# Patient Record
Sex: Female | Born: 2000 | Race: Black or African American | Hispanic: No | Marital: Single | Smoking: Current some day smoker
Health system: Southern US, Community
[De-identification: ages and names within clinical notes are randomized; demographics above are authoritative.]

## PROBLEM LIST (undated history)

## (undated) DIAGNOSIS — L0591 Pilonidal cyst without abscess: Secondary | ICD-10-CM

## (undated) HISTORY — PX: OTHER SURGICAL HISTORY: SHX169

---
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
## 2022-02-23 ENCOUNTER — Other Ambulatory Visit: Payer: Self-pay

## 2022-02-23 ENCOUNTER — Emergency Department
Admission: EM | Admit: 2022-02-23 | Discharge: 2022-02-23 | Discharge status: Against Medical Advice | Payer: Medicaid Other | Attending: Emergency Medicine | Admitting: Emergency Medicine

## 2022-02-23 DIAGNOSIS — R2240 Localized swelling, mass and lump, unspecified lower limb: Secondary | ICD-10-CM | POA: Insufficient documentation

## 2022-02-23 DIAGNOSIS — Z5321 Procedure and treatment not carried out due to patient leaving prior to being seen by health care provider: Secondary | ICD-10-CM | POA: Insufficient documentation

## 2022-02-23 NOTE — ED Notes (Signed)
Pt to stat desk stating she is leaving.  Ambulated out of ED lobby

## 2022-02-23 NOTE — ED Triage Notes (Signed)
Pt c/o left sided swelling and tenderness in external labia. Pt reports she had boil on cocyx that had to be drained in past. Area is slightly swollen, no redness or discharge noted. Pt is AOX4, NAD noted.

## 2022-02-24 ENCOUNTER — Other Ambulatory Visit: Payer: Self-pay

## 2022-02-24 ENCOUNTER — Encounter: Payer: Self-pay | Admitting: Emergency Medicine

## 2022-02-24 ENCOUNTER — Emergency Department
Admission: EM | Admit: 2022-02-24 | Discharge: 2022-02-25 | Disposition: A | Discharge status: Home / Self Care | Payer: Medicaid Other | Attending: Emergency Medicine | Admitting: Emergency Medicine

## 2022-02-24 DIAGNOSIS — R300 Dysuria: Secondary | ICD-10-CM | POA: Diagnosis present

## 2022-02-24 DIAGNOSIS — N764 Abscess of vulva: Secondary | ICD-10-CM | POA: Insufficient documentation

## 2022-02-24 DIAGNOSIS — Z9889 Other specified postprocedural states: Secondary | ICD-10-CM

## 2022-02-24 LAB — URINALYSIS, ROUTINE W REFLEX MICROSCOPIC
Bilirubin Urine: NEGATIVE
Glucose, UA: NEGATIVE mg/dL
Hgb urine dipstick: NEGATIVE
Ketones, ur: 20 mg/dL — AB
Leukocytes,Ua: NEGATIVE
Nitrite: NEGATIVE
Protein, ur: 30 mg/dL — AB
Specific Gravity, Urine: 1.026 (ref 1.005–1.030)
pH: 5 (ref 5.0–8.0)

## 2022-02-24 LAB — POC URINE PREG, ED: Preg Test, Ur: NEGATIVE

## 2022-02-24 MED ORDER — SULFAMETHOXAZOLE-TRIMETHOPRIM 800-160 MG PO TABS
1.0000 | ORAL_TABLET | Freq: Once | ORAL | Status: AC
  Administered 2022-02-24: 1 via ORAL
  Filled 2022-02-24: qty 1

## 2022-02-24 MED ORDER — LIDOCAINE HCL (PF) 1 % IJ SOLN
5.0000 mL | Freq: Once | INTRAMUSCULAR | Status: AC
  Administered 2022-02-24: 5 mL
  Filled 2022-02-24: qty 5

## 2022-02-24 MED ORDER — BENZOCAINE 20 % MT SOLN
Freq: Once | OROMUCOSAL | Status: DC
Start: 2022-02-24 — End: 2022-02-24

## 2022-02-24 MED ORDER — OXYCODONE-ACETAMINOPHEN 5-325 MG PO TABS
1.0000 | ORAL_TABLET | Freq: Once | ORAL | Status: AC
  Administered 2022-02-24: 1 via ORAL
  Filled 2022-02-24: qty 1

## 2022-02-24 MED ORDER — BUPIVACAINE HCL (PF) 0.5 % IJ SOLN
10.0000 mL | Freq: Once | INTRAMUSCULAR | Status: AC
Start: 2022-02-24 — End: 2022-02-24
  Administered 2022-02-24: 10 mL
  Filled 2022-02-24: qty 10

## 2022-02-24 MED ORDER — LIDOCAINE-PRILOCAINE 2.5-2.5 % EX CREA
TOPICAL_CREAM | Freq: Once | CUTANEOUS | Status: AC
  Filled 2022-02-24: qty 5

## 2022-02-24 MED ORDER — AMOXICILLIN-POT CLAVULANATE 875-125 MG PO TABS
1.0000 | ORAL_TABLET | Freq: Once | ORAL | Status: AC
  Administered 2022-02-24: 1 via ORAL
  Filled 2022-02-24: qty 1

## 2022-02-24 MED ORDER — ONDANSETRON 4 MG PO TBDP
4.0000 mg | ORAL_TABLET | Freq: Once | ORAL | Status: AC
  Administered 2022-02-24: 4 mg via ORAL
  Filled 2022-02-24: qty 1

## 2022-02-24 NOTE — ED Triage Notes (Signed)
Pt via POV from home. Pt c/o L sided vaginal labia swelling for the past week, states she feels a "knot" and she thinks it might be an abscess. States that it does burn when she pees. Denies any vaginal discharge. Pt is A&OX4 and NAD

## 2022-02-24 NOTE — ED Provider Notes (Signed)
[facility] Emergency Department Provider Note     Event Date/Time   First MD Initiated Contact with Patient 02/24/22 1957     (approximate)   History   Abscess   HPI  Jacqueline Maynard is a 21 y.o. female with noncontributory medical history, presents to the ED with left-sided labial pain and swelling for the past week and a half.  Patient describes the "knot" that she thinks might be an abscess.  She denies any spontaneous drainage, difficulty urinating or passing stool.  She also denies any vaginal discharge or irritation.  She does note some mild burning discomfort when she passes urine.  She denies any fevers, chills, or sweats.  She denies any history of previous or recurrent vaginal abscesses but she has had a cutaneous abscess on the buttocks.   Physical Exam   Triage Vital Signs: ED Triage Vitals  Enc Vitals Group     BP 02/24/22 1831 (!) 142/99     Pulse Rate 02/24/22 1831 (!) 106     Resp 02/24/22 1831 20     Temp 02/24/22 1831 (!) 97.5 F (36.4 C)     Temp Source 02/24/22 1831 Oral     SpO2 02/24/22 1831 100 %     Weight 02/24/22 1832 134 lb (60.8 kg)     Height 02/24/22 1832 5\' 2"  (1.575 m)     Head Circumference --      Peak Flow --      Pain Score 02/24/22 1832 8     Pain Loc --      Pain Edu? --      Excl. in GC? --     Most recent vital signs: Vitals:   02/24/22 1831 02/25/22 0117  BP: (!) 142/99 115/78  Pulse: (!) 106 88  Resp: 20 15  Temp: (!) 97.5 F (36.4 C) (!) 97.5 F (36.4 C)  SpO2: 100% 100%    General Awake, no distress.  CV:  Good peripheral perfusion.  RESP:  Normal effort.  ABD:  No distention.  GU:  Normal external genitalia, with the exception of a large firm cystic lesion involving the left labia majora and mons pubis.   ED Results / Procedures / Treatments   Labs (all labs ordered are listed, but only abnormal results are displayed) Labs Reviewed  URINALYSIS, ROUTINE W REFLEX MICROSCOPIC -  Abnormal; Notable for the following components:      Result Value   Color, Urine YELLOW (*)    APPearance HAZY (*)    Ketones, ur 20 (*)    Protein, ur 30 (*)    Bacteria, UA RARE (*)    All other components within normal limits  POC URINE PREG, ED     EKG   RADIOLOGY   No results found.   PROCEDURES:  Critical Care performed: No  ..Incision and Drainage  Date/Time: 02/24/2022 9:01 PM  Performed by: Lissa Hoard, PA-C Authorized by: Lissa Hoard, PA-C   Consent:    Consent obtained:  Verbal   Consent given by:  Patient   Risks, benefits, and alternatives were discussed: yes     Risks discussed:  Bleeding, incomplete drainage and pain   Alternatives discussed:  Delayed treatment Universal protocol:    Site/side marked: yes     Patient identity confirmed:  Verbally with patient Location:    Type:  Abscess   Size:  5   Location:  Anogenital   Anogenital location:  Vulva Sedation:    Sedation type:  None Anesthesia:    Anesthesia method:  Local infiltration   Local anesthetic:  Lidocaine 1% w/o epi and bupivacaine 0.5% w/o epi Procedure type:    Complexity:  Complex Procedure details:    Ultrasound guidance: no     Needle aspiration: no     Incision types:  Single straight   Incision depth:  Subcutaneous   Wound management:  Probed and deloculated and irrigated with saline   Drainage:  Purulent and bloody   Drainage amount:  Scant   Wound treatment:  Wound left open   Packing materials:  1/2 in iodoform gauze   Amount 1/2" iodoform:  2 Post-procedure details:    Procedure completion:  Tolerated well, no immediate complications    MEDICATIONS ORDERED IN ED: Medications  lidocaine (PF) (XYLOCAINE) 1 % injection 5 mL (5 mLs Infiltration Given 02/24/22 2107)  bupivacaine(PF) (MARCAINE) 0.5 % injection 10 mL (10 mLs Infiltration Given 02/24/22 2107)  oxyCODONE-acetaminophen (PERCOCET/ROXICET) 5-325 MG per tablet 1 tablet (1 tablet Oral  Given 02/24/22 2105)  ondansetron (ZOFRAN-ODT) disintegrating tablet 4 mg (4 mg Oral Given 02/24/22 2105)  sulfamethoxazole-trimethoprim (BACTRIM DS) 800-160 MG per tablet 1 tablet (1 tablet Oral Given 02/24/22 2105)  amoxicillin-clavulanate (AUGMENTIN) 875-125 MG per tablet 1 tablet (1 tablet Oral Given 02/24/22 2105)  lidocaine-prilocaine (EMLA) cream ( Topical Given 02/24/22 2146)     IMPRESSION / MDM / ASSESSMENT AND PLAN / ED COURSE  I reviewed the triage vital signs and the nursing notes.                              Differential diagnosis includes, but is not limited to, vulvar abscess, pilonidal cyst abscess, Bartholin's gland cyst abscess  Patient's presentation is most consistent with acute complicated illness / injury requiring diagnostic workup.  Patient's diagnosis is consistent with a vulvar abscess.  Patient consented to a local I&D procedure which was performed.  Minimal amount of seropurulent drainage was expressed from the vulvar lesion.  Wound was packed and dressed as appropriate.  Patient will be discharged home with prescriptions for Bactrim, Augmentin, and pain medicine. Patient is to follow up with her primary provider or this ED in 3 days for wound check as needed or otherwise directed. Patient is given ED precautions to return to the ED for any worsening or new symptoms.     FINAL CLINICAL IMPRESSION(S) / ED DIAGNOSES   Final diagnoses:  Left genital labial abscess  Status post incision and drainage     Rx / DC Orders   ED Discharge Orders          Ordered    amoxicillin-clavulanate (AUGMENTIN) 875-125 MG tablet  2 times daily        02/25/22 0111    sulfamethoxazole-trimethoprim (BACTRIM DS) 800-160 MG tablet  2 times daily        02/25/22 0111    HYDROcodone-acetaminophen (NORCO) 5-325 MG tablet  3 times daily PRN        02/25/22 0112             Note:  This document was prepared using Dragon voice recognition software and may include unintentional  dictation errors.    Lissa Hoard, PA-C 02/26/22 2311    Minna Antis, MD 02/26/22 2336

## 2022-02-25 MED ORDER — AMOXICILLIN-POT CLAVULANATE 875-125 MG PO TABS: 1.0000 | ORAL_TABLET | Freq: Two times a day (BID) | ORAL | 0 refills | Status: AC

## 2022-02-25 MED ORDER — SULFAMETHOXAZOLE-TRIMETHOPRIM 800-160 MG PO TABS
1.0000 | ORAL_TABLET | Freq: Two times a day (BID) | ORAL | 0 refills | Status: AC
Start: 2022-02-25 — End: 2022-03-07

## 2022-02-25 MED ORDER — HYDROCODONE-ACETAMINOPHEN 5-325 MG PO TABS: 1.0000 | ORAL_TABLET | Freq: Three times a day (TID) | ORAL | 0 refills | Status: AC | PRN

## 2022-02-25 NOTE — Discharge Instructions (Addendum)
Wound clean, dry, and covered.  Avoid bathtub soaks for the next 3 days.  Remove the packing in 3 days.  Apply warm compresses to promote healing.  Take the 2 antibiotics as directed.  Take the pain medicine as needed.  Follow-up with Phineas Real or this ED for wound check.

## 2022-04-26 ENCOUNTER — Emergency Department
Admission: EM | Admit: 2022-04-26 | Discharge: 2022-04-26 | Disposition: A | Discharge status: Home / Self Care | Payer: Medicaid Other | Attending: Emergency Medicine | Admitting: Emergency Medicine

## 2022-04-26 ENCOUNTER — Other Ambulatory Visit: Payer: Self-pay

## 2022-04-26 DIAGNOSIS — L0501 Pilonidal cyst with abscess: Secondary | ICD-10-CM | POA: Insufficient documentation

## 2022-04-26 DIAGNOSIS — L0231 Cutaneous abscess of buttock: Secondary | ICD-10-CM

## 2022-04-26 MED ORDER — LIDOCAINE-EPINEPHRINE-TETRACAINE (LET) TOPICAL GEL
3.0000 mL | Freq: Once | TOPICAL | Status: AC
  Administered 2022-04-26: 3 mL via TOPICAL
  Filled 2022-04-26: qty 3

## 2022-04-26 MED ORDER — OXYCODONE-ACETAMINOPHEN 5-325 MG PO TABS
1.0000 | ORAL_TABLET | Freq: Once | ORAL | Status: AC
  Administered 2022-04-26: 1 via ORAL
  Filled 2022-04-26: qty 1

## 2022-04-26 MED ORDER — OXYCODONE-ACETAMINOPHEN 5-325 MG PO TABS: 1.0000 | ORAL_TABLET | ORAL | 0 refills | Status: DC | PRN

## 2022-04-26 MED ORDER — LIDOCAINE HCL (PF) 1 % IJ SOLN
5.0000 mL | Freq: Once | INTRAMUSCULAR | Status: AC
  Administered 2022-04-26: 5 mL via INTRADERMAL
  Filled 2022-04-26: qty 5

## 2022-04-26 MED ORDER — CLINDAMYCIN HCL 150 MG PO CAPS: 300.0000 mg | ORAL_CAPSULE | Freq: Three times a day (TID) | ORAL | 0 refills | Status: DC

## 2022-04-26 NOTE — ED Triage Notes (Signed)
Pt comes with c/o abscess to buttocks.

## 2022-04-26 NOTE — Discharge Instructions (Signed)
Follow-up with surgery to discuss having the entire cyst removed Return emergency department worsening Return in 2 days for packing removal Take the antibiotic and pain medication as prescribed It is okay if the packing falls out.  If it falls out on its own you do not need to return in 2 days you just need to follow-up with surgery

## 2022-04-26 NOTE — ED Provider Notes (Signed)
[facility] Provider Note    Event Date/Time   First MD Initiated Contact with Patient 04/26/22 1115     (approximate)   History   Abscess   HPI  Jacqueline Maynard is a 21 y.o. 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  Patient states that pain started 2 days ago.  States that severe pain.  She is also on her menstrual cycle which is making it worse.  She denies fever or chills.  Drainage from the area      Physical Exam   Triage Vital Signs: ED Triage Vitals  Enc Vitals Group     BP 04/26/22 1053 (!) 143/98     Pulse Rate 04/26/22 1053 (!) 130     Resp 04/26/22 1053 17     Temp 04/26/22 1053 98.2 F (36.8 C)     Temp src --      SpO2 04/26/22 1053 95 %     Weight --      Height --      Head Circumference --      Peak Flow --      Pain Score 04/26/22 1046 10     Pain Loc --      Pain Edu? --      Excl. in GC? --     Most recent vital signs: Vitals:   04/26/22 1053  BP: (!) 143/98  Pulse: (!) 130  Resp: 17  Temp: 98.2 F (36.8 C)  SpO2: 95%     General: Awake, no distress.   CV:  Good peripheral perfusion. regular rate and  rhythm Resp:  Normal effort.  Abd:  No distention.   Other:  Right buttock with swelling which is very minimal and redness noted to the pilonidal area, old incision is noted in the same area,   ED Results / Procedures / Treatments   Labs (all labs ordered are listed, but only abnormal results are displayed) Labs Reviewed - No data to display   EKG     RADIOLOGY     PROCEDURES:   .Marland KitchenIncision and Drainage  Date/Time: 04/26/2022 1:07 PM  Performed by: Faythe Ghee, PA-C Authorized by: Faythe Ghee, PA-C   Consent:    Consent obtained:  Verbal   Consent given by:  Patient   Risks, benefits, and alternatives were discussed: yes     Risks discussed:  Bleeding, incomplete drainage, pain, damage to other organs and  infection   Alternatives discussed:  Delayed treatment Universal protocol:    Immediately prior to procedure, a time out was called: yes     Patient identity confirmed:  Verbally with patient Location:    Type:  Pilonidal cyst Pre-procedure details:    Skin preparation:  Povidone-iodine Anesthesia:    Anesthesia method:  Topical application and local infiltration   Topical anesthetic:  LET   Local anesthetic:  Lidocaine 1% w/o epi Procedure type:    Complexity:  Simple Procedure details:    Incision types:  Stab incision   Incision depth:  Dermal   Wound management:  Probed and deloculated and irrigated with saline   Drainage:  Bloody and purulent   Drainage amount:  Copious   Wound treatment:  Drain placed   Packing materials:  1/2 in iodoform gauze Post-procedure details:    Procedure completion:  Tolerated well, no immediate complications    MEDICATIONS ORDERED IN ED: Medications  lidocaine-EPINEPHrine-tetracaine (LET) topical gel (  3 mLs Topical Given 04/26/22 1156)  oxyCODONE-acetaminophen (PERCOCET/ROXICET) 5-325 MG per tablet 1 tablet (1 tablet Oral Given 04/26/22 1155)  lidocaine (PF) (XYLOCAINE) 1 % injection 5 mL (5 mLs Intradermal Given by Other 04/26/22 1308)     IMPRESSION / MDM / ASSESSMENT AND PLAN / ED COURSE  I reviewed the triage vital signs and the nursing notes.                              Differential diagnosis includes, but is not limited to, abscess, cellulitis, herpes  Patient's presentation is most consistent with acute, uncomplicated illness.   The area of pain is very indicative of a pilonidal abscess.  We will have nursing staff give the patient a Percocet by mouth, LAT to be applied to the area.  We will then insert additional numbing medication.  Do not feel like the patient needs a CT to rule out rectal abscess as this area seems to be very localized.  With incision and drainage there was a very large amount of pus expelled.  It is very  copious yellow to green without harsh odor.  I feel that the patient should follow-up with surgery to have the pilonidal cyst removed.  However do have concerns that if this returns that the infection may be tracking into the lower part of the buttock.  She was instructed to return to the emergency department in 2 days for packing removal.  Take the antibiotic can pain medication as needed.  She is in agreement treatment plan.  She was discharged in stable condition.   FINAL CLINICAL IMPRESSION(S) / ED DIAGNOSES   Final diagnoses:  Abscess of buttock, right     Rx / DC Orders   ED Discharge Orders          Ordered    clindamycin (CLEOCIN) 150 MG capsule  3 times daily        04/26/22 1303    oxyCODONE-acetaminophen (PERCOCET) 5-325 MG tablet  Every 4 hours PRN        04/26/22 1303             Note:  This document was prepared using Dragon voice recognition software and may include unintentional dictation errors.    Faythe Ghee, PA-C 04/26/22 1309    Sharman Cheek, MD 04/26/22 [id number]

## 2022-04-28 ENCOUNTER — Encounter: Payer: Self-pay | Admitting: Emergency Medicine

## 2022-04-28 ENCOUNTER — Emergency Department
Admission: EM | Admit: 2022-04-28 | Discharge: 2022-04-28 | Disposition: A | Discharge status: Home / Self Care | Payer: Medicaid Other | Attending: Emergency Medicine | Admitting: Emergency Medicine

## 2022-04-28 ENCOUNTER — Other Ambulatory Visit: Payer: Self-pay

## 2022-04-28 DIAGNOSIS — Z4800 Encounter for change or removal of nonsurgical wound dressing: Secondary | ICD-10-CM | POA: Insufficient documentation

## 2022-04-28 DIAGNOSIS — Z5189 Encounter for other specified aftercare: Secondary | ICD-10-CM

## 2022-04-28 NOTE — ED Triage Notes (Signed)
Pt reports had an abscess drained and packed and is back for a recheck. Pt reports feels better

## 2022-04-28 NOTE — Discharge Instructions (Signed)
-  Please a 4 x 4 gauze pad along the affected area to catch the pulses drains.  -You may continue to wash daily in the shower with soap and water.  -Follow through with your appointment with the general surgeon in a few weeks.  -Continue taking all of your antibiotics as prescribed.  -Return to the emergency department anytime if you begin to experience any new or worsening symptoms.

## 2022-04-28 NOTE — ED Notes (Signed)
Pt A&Ox4 ambulatory at d/c with independent steady gait. Pt verbalized understanding of d/c instruction and follow up care.

## 2022-04-28 NOTE — ED Provider Notes (Signed)
[facility] Provider Note    Event Date/Time   First MD Initiated Contact with Patient 04/28/22 1704     (approximate)   History   Chief Complaint Wound Check   HPI Jacqueline Maynard is a 21 y.o. female, no significant medical history, presents emergency department for wound check of previous pilonidal cyst that was drained on 04/26/22.  Wound packing was placed.  She was advised to return to the emergency department for packing removal and wound recheck.  She states that she has felt much better since the initial drainage.  Has not had any fever/chills, body aches, or excessive bleeding/drainage from the affected site.  No other symptoms.  She has been taken her clindamycin as prescribed.  She has an appointment scheduled with general surgery in a few weeks.  History Limitations: No limitations.        Physical Exam  Triage Vital Signs: ED Triage Vitals  Enc Vitals Group     BP 04/28/22 1504 133/88     Pulse Rate 04/28/22 1504 81     Resp 04/28/22 1504 20     Temp 04/28/22 1504 98.1 F (36.7 C)     Temp Source 04/28/22 1504 Oral     SpO2 04/28/22 1504 98 %     Weight 04/28/22 1502 132 lb 4.4 oz (60 kg)     Height 04/28/22 1502 5\' 2"  (1.575 m)     Head Circumference --      Peak Flow --      Pain Score --      Pain Loc --      Pain Edu? --      Excl. in GC? --     Most recent vital signs: Vitals:   04/28/22 1504 04/28/22 1733  BP: 133/88 101/85  Pulse: 81 78  Resp: 20 18  Temp: 98.1 F (36.7 C)   SpO2: 98% 97%    General: Awake, NAD.  Skin: Warm, dry. No rashes or lesions. Eyes: PERRL. Conjunctivae normal.  CV: Good peripheral perfusion.  Resp: Normal effort.  Abd: Soft, non-tender. No distention.  Neuro: At baseline. No gross neurological deficits.  Musculoskeletal: Normal ROM of all extremities.   Focused Exam: 1 cm incision site immediately adjacent to the gluteal cleft on the right side.  Wound packing in place, appears  soaked.  Moderate amount of drainage present after removal of packing, though did cease after a few minutes.  No erythema, tenderness, or fluctuance around the wound edges.  Appears to be healing well.  Physical Exam    ED Results / Procedures / Treatments  Labs (all labs ordered are listed, but only abnormal results are displayed) Labs Reviewed - No data to display   EKG N/A.   RADIOLOGY  ED Provider Interpretation: N/A.  No results found.  PROCEDURES:  Critical Care performed: N/A.  Procedures    MEDICATIONS ORDERED IN ED: Medications - No data to display   IMPRESSION / MDM / ASSESSMENT AND PLAN / ED COURSE  I reviewed the triage vital signs and the nursing notes.                              Differential diagnosis includes, but is not limited to, pilonidal cyst, wound recheck, cellulitis  Assessment/Plan Patient presents for wound recheck and packing removal after initial incision/drainage of pilonidal cyst on 04/26/22.  Appears to be healing well.  Mild amount of drainage  present after packing removal, though I do not believe that she needs any additional packing material at this time.  Recommended that she place 4 x 4 gauze along the gluteal cleft daily to help catch additional drainage throughout the day.  Recommend that she continue taking her antibiotics as prescribed and to follow through with the appointment with general surgery in a few weeks.  She was agreeable to this plan.  We will discharge.  Provided the patient with anticipatory guidance, return precautions, and educational material. Encouraged the patient to return to the emergency department at any time if they begin to experience any new or worsening symptoms. Patient expressed understanding and agreed with the plan.   Patient's presentation is most consistent with acute, uncomplicated illness.       FINAL CLINICAL IMPRESSION(S) / ED DIAGNOSES   Final diagnoses:  Visit for wound check      Rx / DC Orders   ED Discharge Orders     None        Note:  This document was prepared using Dragon voice recognition software and may include unintentional dictation errors.   Varney Daily, [place] 04/28/22 1739    Pilar Jarvis, MD 04/28/22 2010

## 2022-05-14 ENCOUNTER — Encounter: Payer: Self-pay | Admitting: Surgery

## 2022-05-14 ENCOUNTER — Ambulatory Visit (INDEPENDENT_AMBULATORY_CARE_PROVIDER_SITE_OTHER): Payer: Medicaid Other | Admitting: Surgery

## 2022-05-14 ENCOUNTER — Other Ambulatory Visit: Payer: Self-pay

## 2022-05-14 VITALS — BP 112/63 | HR 84 | Temp 98.1°F | Ht 60.0 in | Wt 132.0 lb

## 2022-05-14 DIAGNOSIS — L0591 Pilonidal cyst without abscess: Secondary | ICD-10-CM | POA: Diagnosis not present

## 2022-05-14 NOTE — Patient Instructions (Addendum)
Finish the full course of Antibiotics.  You may try shaving the around the area or using Darene Lamer hair removal.   Please call the office if the area worsens.   Pilonidal Cyst Drainage  A pilonidal cyst is a fluid-filled sac that forms under the skin near the tailbone, at the top of the crease of the buttocks (pilonidal area). Incision and drainage is a procedure to open and drain a pilonidal cyst. You may need this procedure if your cyst becomes infected (pilonidal abscess). A pilonidal abscess may cause pain and swelling. There are three types of procedures to drain a pilonidal cyst. The type of procedure you have will depend on the size, location, and severity of your cyst. You may have: Incision and drainage with wound packing. Wound packing involves placing a germ-free packing material (gauze) into the incision. Marsupialization. This involves opening and draining the cyst. The edges of the incision are then stitched (sutured) to the skin around the incision. Incision and drainage without wound packing. The incision is closed and covered with a dressing. Tell a health care provider about: Any allergies you have. All medicines you are taking, including vitamins, herbs, eye drops, creams, and over-the-counter medicines. Any problems you or family members have had with anesthetic medicines. Any bleeding problems you have. Any surgeries you have had. Any medical conditions you have. Whether you are pregnant or may be pregnant. What are the risks? Talk to your health care provider about risks. These may include: Infection. Bleeding. Allergic reactions to medicines. The cyst coming back again (recurrence). What happens before the procedure? When to stop eating and drinking Follow instructions from your health care provider about what you may eat and drink. These may include: 8 hours before your procedure Stop eating most foods. Do not eat meat, fried foods, or fatty foods. Eat only light  foods, such as toast or crackers. All liquids are okay except energy drinks and alcohol. 6 hours before your procedure Stop eating. Drink only clear liquids, such as water, clear fruit juice, black coffee, plain tea, and sports drinks. Do not drink energy drinks or alcohol. 2 hours before your procedure Stop drinking all liquids. You may be allowed to take medicines with small sips of water. If you do not follow your health care provider's instructions, your procedure may be delayed or canceled. Medicines Ask your health care provider about: Changing or stopping your regular medicines. These include any diabetes medicines or blood thinners you take. Taking medicines such as aspirin and ibuprofen. These medicines can thin your blood. Do not take them unless your health care provider tells you to. Taking over-the-counter medicines, vitamins, herbs, and supplements. Surgery safety Ask your health care provider: How your surgery site will be marked. What steps will be taken to help prevent infection. These steps may include: Removing hair at the surgery site. Washing skin with a soap that kills germs. Taking antibiotics. General instructions Do not use any products that contain nicotine or tobacco for at least 4 weeks before the procedure. These products include cigarettes, chewing tobacco, and vaping devices, such as e-cigarettes. If you need help quitting, ask your health care provider. If you will be going home right after the procedure, plan to have a responsible adult: Take you home from the hospital or clinic. You will not be allowed to drive. Care for you for the time you are told. You may need help with wound care and dressing changes. What happens during the procedure? An IV may be  inserted into one of your veins. You may be given: A sedative. This helps you relax. Anesthesia. This will: Numb certain areas of your body. Make you fall asleep for surgery. You will lie down on  your stomach. Tape may be used to spread your buttocks. The area around the cyst will be cleaned. An incision will be made in the cyst. Your surgeon may insert a tube with a light and camera on the end (probe). This is done to see how deep the cyst is. Fluid or pus inside the cyst will be drained. The cyst will be flushed out (irrigated). The rest of the procedure will depend on the type of procedure you are having. For incision and drainage with wound packing: Gauze will be placed into the wound. This keeps the wound open so it can keep draining after surgery. It also helps the wound heal from the inside out. The area will be covered with a dressing. For marsupialization: The edges of the incision will be stitched to your skin. This keeps the wound open while it heals. It also helps deep wounds heal from the inside out. A dressing will be placed over the wound. For incision and drainage without wound packing: Some tissue around the cyst may be removed. The incision may be closed with sutures, skin glue, or adhesive strips. The incision will be covered with a dressing. These procedures may vary among health care providers and hospitals. What happens after the procedure? Your blood pressure, heart rate, breathing rate, and blood oxygen level will be monitored until you leave the hospital or clinic. You will be given medicine for pain as needed. If you were given a sedative during the procedure, it can affect you for several hours. Do not drive or operate machinery until your health care provider says that it is safe. Summary A pilonidal cyst is a fluid-filled sac that forms under the skin near the tailbone, at the top of the crease of the buttocks (pilonidal area). Incision and drainage is a procedure to open and drain the cyst. The type of procedure you have will depend on the size, location, and severity of your cyst. You may need this procedure if your cyst becomes infected (pilonidal  abscess). This information is not intended to replace advice given to you by your health care provider. Make sure you discuss any questions you have with your health care provider. Document Revised: 11/08/2021 Document Reviewed: 11/08/2021 Elsevier Patient Education  [place].

## 2022-05-15 ENCOUNTER — Encounter: Payer: Self-pay | Admitting: Surgery

## 2022-05-15 NOTE — Progress Notes (Signed)
Patient ID: Jacqueline Maynard, female   DOB: 08-Sep-2000, 21 y.o.   MRN: [id number]  HPI Jacqueline Maynard is a 21 y.o. female seen in consultation at the request of Caesar Chestnut, PA-C.  He did have an I&D of pilonidal abscess 2-1/2 weeks ago by the ER.  She was managed with antibiotics and now her symptoms have improved significantly.  She denies any major discharge.  Some very mild discomfort, mild pain  that is intermittent and sharp.  Does not have a wound anymore.  She is states that she has had this before a total of total of two times.  Does have some chronic anemia and last hemoglobin was 10.4.  Rest of the labs are normal except mild hypokalemia CXR personally reviewed No active cardiopulmonary disease  HPI  History reviewed. No pertinent past medical history.  Past Surgical History:  Procedure Laterality Date   i&d pilonidal cyst      History reviewed. No pertinent family history.  Social History Social History   Tobacco Use   Smoking status: Never   Smokeless tobacco: Never  Substance Use Topics   Alcohol use: No   Drug use: Yes    Allergies  Allergen Reactions   Peanut-Containing Drug Products Hives and Rash    Current Outpatient Medications  Medication Sig Dispense Refill   clindamycin (CLEOCIN) 150 MG capsule Take 2 capsules (300 mg total) by mouth 3 (three) times daily. 42 capsule 0   No current facility-administered medications for this visit.     Review of Systems Full ROS  was asked and was negative except for the information on the HPI  Physical Exam Blood pressure 112/63, pulse 84, temperature 98.1 F (36.7 C), temperature source Oral, height 5' (1.524 m), weight 132 lb (59.9 kg), last menstrual period 04/28/2022, SpO2 100 %. CONSTITUTIONAL: NAD. EYES: Pupils are equal, round,  Sclera are non-icteric.  Chaperone present EARS, NOSE, MOUTH AND THROAT: The oral mucosa is pink and moist. Hearing is intact to voice. LYMPH NODES:  Lymph nodes in the neck  are normal. RESPIRATORY:  Lungs are clear. There is normal respiratory effort, with equal breath sounds bilaterally, and without pathologic use of accessory muscles. CARDIOVASCULAR: Heart is regular without murmurs, gallops, or rubs. GI: The abdomen is  soft, nontender, and nondistended. There are no palpable masses. There is no hepatosplenomegaly. There are normal bowel sounds in all quadrants. GU: Rectal deferred.   MUSCULOSKELETAL: Normal muscle strength and tone. No cyanosis or edema.   Rectal: no masses and no bleeding SKIN: Turgor is good and there are no pathologic skin lesions or ulcers.  There is evidence of his scar to the right of the gluteal cleft.  There is very minimal induration there is no evidence of erythema there is no evidence of fluctuance or evidence of abscess NEUROLOGIC: Motor and sensation is grossly normal. Cranial nerves are grossly intact. PSYCH:  Oriented to person, place and time. Affect is normal.  Data Reviewed  I have personally reviewed the patient's imaging, laboratory findings and medical records.    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  Now she has a completely healed sacral skin and soft tissue.  No need for further surgical intervention or debridement at this time.  Discussed with her in detail about prevention of pilonidal disease with appropriate shaving.  If she were to recur again she will likely benefit from excision of pilonidal disease.  We will be available in case this happens.  She knows to call  if she has any further issues I spent  45 minutes in this encounter including coordination of her care, personally reviewing imaging studies, counseling the patient, placing orders and performing appropriate documentation  Sterling Big, MD FACS General Surgeon 05/15/2022, 9:54 AM

## 2022-09-06 ENCOUNTER — Encounter: Payer: Self-pay | Admitting: *Deleted

## 2022-09-06 ENCOUNTER — Other Ambulatory Visit: Payer: Self-pay

## 2022-09-06 ENCOUNTER — Emergency Department
Admission: EM | Admit: 2022-09-06 | Discharge: 2022-09-06 | Disposition: A | Discharge status: Home / Self Care | Payer: Medicaid Other | Attending: Emergency Medicine | Admitting: Emergency Medicine

## 2022-09-06 DIAGNOSIS — L0501 Pilonidal cyst with abscess: Secondary | ICD-10-CM | POA: Insufficient documentation

## 2022-09-06 DIAGNOSIS — L0291 Cutaneous abscess, unspecified: Secondary | ICD-10-CM

## 2022-09-06 MED ORDER — AMOXICILLIN-POT CLAVULANATE 875-125 MG PO TABS: 1.0000 | ORAL_TABLET | Freq: Two times a day (BID) | ORAL | 0 refills | Status: DC

## 2022-09-06 MED ORDER — LIDOCAINE-PRILOCAINE 2.5-2.5 % EX CREA
TOPICAL_CREAM | Freq: Once | CUTANEOUS | Status: DC
  Filled 2022-09-06: qty 5

## 2022-09-06 NOTE — ED Triage Notes (Signed)
Pt has a pilonidal cyst on the right buttock.  Sx for several weeks.  No drainage.  Pt has increased pain with swelling .  Pt alert.

## 2022-09-06 NOTE — ED Provider Triage Note (Signed)
Emergency Medicine Provider Triage Evaluation Note  Jacqueline Maynard, a 22 y.o. female  was evaluated in triage.  Pt complains of recurrence of abdominal cyst to the right buttocks.  Patient notes symptoms for the last several weeks but denies any spontaneous drainage.  She notes increasing pain and fullness pointing at this time.  No fevers chills or sweats reported.  Review of Systems  Positive: Pilonidal cyst Negative: FCS  Physical Exam  BP 125/82 (BP Location: Left Arm)   Pulse 96   Temp 99 F (37.2 C) (Oral)   Resp 18   Ht 5\' 2"  (1.575 m)   Wt 57.2 kg   LMP 08/23/2022   SpO2 99%   BMI 23.05 kg/m  Gen:   Awake, no distress   Resp:  Normal effort  MSK:   Moves extremities without difficulty  Other:    Medical Decision Making  Medically screening exam initiated at 8:10 PM.  Appropriate orders placed.  Jacqueline Maynard was informed that the remainder of the evaluation will be completed by another provider, this initial triage assessment does not replace that evaluation, and the importance of remaining in the ED until their evaluation is complete.  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   Melvenia Needles, PA-C 09/06/22 2014

## 2022-09-06 NOTE — Discharge Instructions (Signed)
Take Augmentin twice daily for seven days. Remove packing in three days.

## 2022-09-06 NOTE — ED Provider Notes (Signed)
[facility] Provider Note  Patient Contact: 11:35 PM (approximate)   History   Abscess   HPI  Jacqueline Maynard is a 22 y.o. female presents to the emergency department with a pilonidal cyst.  Patient states that she has required incision and drainage in the past.  No fever or chills.  Her pain is progressively worsening.      Physical Exam   Triage Vital Signs: ED Triage Vitals  Enc Vitals Group     BP 09/06/22 2003 125/82     Pulse Rate 09/06/22 2003 96     Resp 09/06/22 2003 18     Temp 09/06/22 2003 99 F (37.2 C)     Temp Source 09/06/22 2003 Oral     SpO2 09/06/22 2003 99 %     Weight 09/06/22 2004 126 lb (57.2 kg)     Height 09/06/22 2004 5\' 2"  (1.575 m)     Head Circumference --      Peak Flow --      Pain Score 09/06/22 2004 7     Pain Loc --      Pain Edu? --      Excl. in [place]? --     Most recent vital signs: Vitals:   09/06/22 2003  BP: 125/82  Pulse: 96  Resp: 18  Temp: 99 F (37.2 C)  SpO2: 99%     General: Alert and in no acute distress. Eyes:  PERRL. EOMI. Head: No acute traumatic findings ENT:      Nose: No congestion/rhinnorhea.      Mouth/Throat: Mucous membranes are moist. Neck: No stridor. No cervical spine tenderness to palpation. Cardiovascular:  Good peripheral perfusion Respiratory: Normal respiratory effort without tachypnea or retractions. Lungs CTAB. Good air entry to the bases with no decreased or absent breath sounds. Gastrointestinal: Bowel sounds 4 quadrants. Soft and nontender to palpation. No guarding or rigidity. No palpable masses. No distention. No CVA tenderness. Musculoskeletal: Full range of motion to all extremities.  Neurologic:  No gross focal neurologic deficits are appreciated.  Skin: Patient has a 3 cm x 3 cm pilonidal abscess. Other:   ED Results / Procedures / Treatments   Labs (all labs ordered are listed, but only abnormal results are displayed) Labs Reviewed - No data to  display      PROCEDURES:  Critical Care performed: No  ..Incision and Drainage  Date/Time: 09/06/2022 11:44 PM  Performed by: Lannie Fields, PA-C Authorized by: Lannie Fields, PA-C   Consent:    Consent obtained:  Verbal   Risks discussed:  Bleeding and incomplete drainage Universal protocol:    Procedure explained and questions answered to patient or proxy's satisfaction: yes     Patient identity confirmed:  Verbally with patient Location:    Type:  Abscess   Location:  Anogenital   Anogenital location:  Pilonidal Pre-procedure details:    Skin preparation:  Povidone-iodine Sedation:    Sedation type:  None Anesthesia:    Anesthesia method:  Topical application   Topical anesthetic:  EMLA cream Procedure type:    Complexity:  Simple Procedure details:    Incision types:  Stab incision   Drainage:  Purulent   Drainage amount:  Moderate   Packing materials:  None Post-procedure details:    Procedure completion:  Tolerated well, no immediate complications    MEDICATIONS ORDERED IN ED: Medications  lidocaine-prilocaine (EMLA) cream (has no administration in time range)     IMPRESSION / MDM /  ASSESSMENT AND PLAN / ED COURSE  I reviewed the triage vital signs and the nursing notes.                             Assessment and plan Pilonidal abscess 22 year old female presents to the emergency department with a pilonidal cyst.  Patient underwent incision and drainage without complication.  Patient was discharged with Augmentin.  Return precautions were given to return with new or worsening symptoms. FINAL CLINICAL IMPRESSION(S) / ED DIAGNOSES   Final diagnoses:  Abscess     Rx / DC Orders   ED Discharge Orders          Ordered    amoxicillin-clavulanate (AUGMENTIN) 875-125 MG tablet  2 times daily        09/06/22 2323             Note:  This document was prepared using Dragon voice recognition software and may include unintentional dictation  errors.   Vallarie Mare [place], PA-C 09/06/22 2346    Merlyn Lot, MD 09/06/22 2352

## 2022-09-10 ENCOUNTER — Telehealth: Payer: Self-pay | Admitting: Surgery

## 2022-09-10 ENCOUNTER — Encounter: Payer: Self-pay | Admitting: Surgery

## 2022-09-10 ENCOUNTER — Other Ambulatory Visit: Payer: Self-pay

## 2022-09-10 ENCOUNTER — Ambulatory Visit (INDEPENDENT_AMBULATORY_CARE_PROVIDER_SITE_OTHER): Payer: Medicaid Other | Admitting: Surgery

## 2022-09-10 VITALS — BP 134/90 | HR 132 | Temp 95.6°F | Ht 62.0 in | Wt 124.0 lb

## 2022-09-10 DIAGNOSIS — L0591 Pilonidal cyst without abscess: Secondary | ICD-10-CM | POA: Diagnosis not present

## 2022-09-10 NOTE — Telephone Encounter (Signed)
Tried calling patient, not able to leave a message, voice mailbox not available.  If patient calls back, please inform her of the following regarding scheduled surgery with Dr. Dahlia Byes   Pre-Admission date/time, and Surgery date at [facility].  Surgery Date: 10/04/22 Preadmission Testing Date: 09/25/22 (phone 8a-1p)  Also patient will need to call at [phone], between 1-3:00pm the day before surgery, to find out what time to arrive for surgery.

## 2022-09-10 NOTE — Telephone Encounter (Signed)
Tried calling patient to discuss surgery date options with her, but couldn't leave message, mailbox was full.

## 2022-09-10 NOTE — Patient Instructions (Addendum)
Our surgery scheduler will call you within 24-48 hours to schedule your surgery. Please have the [facility] surgery sheet available when speaking with her.    Pilonidal Cyst  A pilonidal cyst is a fluid-filled sac that forms under the skin near the tailbone, at the top of the crease of the buttocks (pilonidal area). Cysts that are small and not infected may not cause any problems. Cysts that become irritated or infected may grow and fill with pus. An infected cyst is called an abscess. A pilonidal abscess may cause pain and swelling. It may need to be drained or removed. What are the causes? The cause of this condition is not always known. In some cases, it may be caused by a hair that grows into your skin (ingrown hair). What increases the risk? You are more likely to develop this condition if: You are female. You have lots of hair near the crease of the buttocks. You are overweight. You have a dimple near the crease of the buttocks. You wear tight clothing. You do not bathe or shower often. You sit for long periods of time. What are the signs or symptoms? Symptoms of this condition may include pain, swelling, redness, and warmth in the pilonidal area. You may also be able to feel a lump near your tailbone if your cyst is big. If your cyst becomes infected, symptoms may include: Pus or fluid drainage. Fever. Pain, swelling, and redness getting worse. The lump getting bigger. How is this diagnosed? This condition may be diagnosed based on: Your symptoms and medical history. A physical exam. A blood test to check for infection. A test of a pus sample. How is this treated? You may not need any treatment if your cyst does not cause symptoms. If your cyst bothers you or is infected, you may need a procedure to drain or remove the cyst. Depending on the size, location, and severity of your cyst, your health care provider may: Make an incision in the cyst and drain it (incision anddrainage). Open  and drain the cyst, and then stitch the wound so that it stays open while it heals (marsupialization). You will be given instructions about how to care for your open wound while it heals. Remove all or part of the cyst, and then close the wound (cyst removal). You may need to take antibiotics before your procedure. Follow these instructions at home: Medicines Take over-the-counter and prescription medicines only as told by your health care provider. If you were prescribed antibiotics, take them as told by your health care provider. Do not stop taking the antibiotic even if you start to feel better. General instructions Keep the area around the cyst clean and dry. If there is fluid or pus draining from your cyst: Cover the area with a clean bandage (dressing). Wash the area gently with soap and water. Pat the area dry with a clean towel. Do not rub the area because that may cause bleeding. Remove hair from the area around the cyst only if your health care provider tells you to. Do not wear tight pants or sit in one position for long periods at a time. Contact a health care provider if: You have new redness, swelling, or pain. You have a fever. You have severe pain. Summary A pilonidal cyst is a fluid-filled sac that forms under the skin near the tailbone, at the top of the crease of the buttocks (pilonidal area). Cysts that become irritated or infected may grow and fill with pus. An  infected cyst is called an abscess. The cause of this condition is not always known. In some cases, it may be caused by a hair that grows into your skin (ingrown hair). You may not need any treatment if your cyst does not cause symptoms. If your cyst bothers you or is infected, you may need a procedure to drain or remove the cyst. This information is not intended to replace advice given to you by your health care provider. Make sure you discuss any questions you have with your health care provider. Document  Revised: 11/08/2021 Document Reviewed: 11/08/2021 Elsevier Patient Education  [place].

## 2022-09-11 NOTE — Telephone Encounter (Signed)
Left message with mom for patient to give [place] a call so that we can inform of the following scheduled surgery with Dr. Dahlia Byes.

## 2022-09-13 NOTE — Telephone Encounter (Signed)
Patient calls back, she is now informed of all dates regarding her surgery.   

## 2022-09-16 NOTE — Progress Notes (Signed)
Patient ID: Jacqueline Maynard, female   DOB: 09/28/2000, 22 y.o.   MRN: [id number]  HPI Jacqueline Maynard is a 22 y.o. female well-known to me with history of bilateral disease with prior I&D last year.  She now has recurrence of symptoms with sacrococcygeal pain that is sharp and intermittent and moderate intensity.  She also experiences chronic drainage.  No fevers no chills.  She is ready to have this taken care of  HPI  History reviewed. No pertinent past medical history.  Past Surgical History:  Procedure Laterality Date   i&d pilonidal cyst      History reviewed. No pertinent family history.  Social History Social History   Tobacco Use   Smoking status: Some Days    Types: Cigarettes   Smokeless tobacco: Never  Substance Use Topics   Alcohol use: Yes   Drug use: Yes    Allergies  Allergen Reactions   Peanut-Containing Drug Products Hives and Rash    No current outpatient medications on file.   No current facility-administered medications for this visit.     Review of Systems Full ROS  was asked and was negative except for the information on the HPI  Physical Exam Blood pressure (!) 134/90, pulse (!) 132, temperature (!) 95.6 F (35.3 C), temperature source Oral, height 5' 2" (1.575 m), weight 124 lb (56.2 kg), last menstrual period 08/23/2022, SpO2 100 %. CONSTITUTIONAL: NAD. EYES: Pupils are equal, round,  Sclera are non-icteric. EARS, NOSE, MOUTH AND THROAT: The oropharynx is clear. The oral mucosa is pink and moist. Hearing is intact to voice. LYMPH NODES:  Lymph nodes in the neck are normal. RESPIRATORY:  Lungs are clear. There is normal respiratory effort, with equal breath sounds bilaterally, and without pathologic use of accessory muscles. CARDIOVASCULAR: Heart is regular without murmurs, gallops, or rubs. GI: The abdomen is  soft, nontender, and nondistended. There are no palpable masses. There is no hepatosplenomegaly. There are normal bowel sounds in all  quadrants. GU: Rectal deferred.   MUSCULOSKELETAL: Normal muscle strength and tone. No cyanosis or edema.   SKIN:  There is evidence of a small.  Within sacrococcygeal area midline.  Some induration consistent with active pilonidal disease.  NEUROLOGIC: Motor and sensation is grossly normal. Cranial nerves are grossly intact. PSYCH:  Oriented to person, place and time. Affect is normal.  Data Reviewed  I have personally reviewed the patient's imaging, laboratory findings and medical records.    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  Discussed with patient in detail about options and I do recommend excision.  Discussed with her that I typically do excision and let it heal by secondary intention..  Discussed with her in detail.  Risk, benefits and possible implications including but not limited to: Bleeding, infection, chronic pain and prolonged wound healing.  She understands and wished to proceed.  Please note this plan 40 minutes in this encounter including personally reviewing imaging studies, coordinating her care, placing orders and performing appropriate documentation  Tawonna Esquer, MD FACS General Surgeon 09/16/2022, 7:42 PM   

## 2022-09-16 NOTE — H&P (View-Only) (Signed)
Patient ID: Jacqueline Maynard, female   DOB: 07/20/2001, 22 y.o.   MRN: [id number]  HPI Jacqueline Maynard is a 22 y.o. female well-known to me with history of bilateral disease with prior I&D last year.  She now has recurrence of symptoms with sacrococcygeal pain that is sharp and intermittent and moderate intensity.  She also experiences chronic drainage.  No fevers no chills.  She is ready to have this taken care of  HPI  History reviewed. No pertinent past medical history.  Past Surgical History:  Procedure Laterality Date   i&d pilonidal cyst      History reviewed. No pertinent family history.  Social History Social History   Tobacco Use   Smoking status: Some Days    Types: Cigarettes   Smokeless tobacco: Never  Substance Use Topics   Alcohol use: Yes   Drug use: Yes    Allergies  Allergen Reactions   Peanut-Containing Drug Products Hives and Rash    No current outpatient medications on file.   No current facility-administered medications for this visit.     Review of Systems Full ROS  was asked and was negative except for the information on the HPI  Physical Exam Blood pressure (!) 134/90, pulse (!) 132, temperature (!) 95.6 F (35.3 C), temperature source Oral, height 5\' 2"  (1.575 m), weight 124 lb (56.2 kg), last menstrual period 08/23/2022, SpO2 100 %. CONSTITUTIONAL: NAD. EYES: Pupils are equal, round,  Sclera are non-icteric. EARS, NOSE, MOUTH AND THROAT: The oropharynx is clear. The oral mucosa is pink and moist. Hearing is intact to voice. LYMPH NODES:  Lymph nodes in the neck are normal. RESPIRATORY:  Lungs are clear. There is normal respiratory effort, with equal breath sounds bilaterally, and without pathologic use of accessory muscles. CARDIOVASCULAR: Heart is regular without murmurs, gallops, or rubs. GI: The abdomen is  soft, nontender, and nondistended. There are no palpable masses. There is no hepatosplenomegaly. There are normal bowel sounds in all  quadrants. GU: Rectal deferred.   MUSCULOSKELETAL: Normal muscle strength and tone. No cyanosis or edema.   SKIN:  There is evidence of a small.  Within sacrococcygeal area midline.  Some induration consistent with active pilonidal disease.  NEUROLOGIC: Motor and sensation is grossly normal. Cranial nerves are grossly intact. PSYCH:  Oriented to person, place and time. Affect is normal.  Data Reviewed  I have personally reviewed the patient's imaging, laboratory findings and medical records.    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  Discussed with patient in detail about options and I do recommend excision.  Discussed with her that I typically do excision and let it heal by secondary intention..  Discussed with her in detail.  Risk, benefits and possible implications including but not limited to: Bleeding, infection, chronic pain and prolonged wound healing.  She understands and wished to proceed.  Please note this plan 40 minutes in this encounter including personally reviewing imaging studies, coordinating her care, placing orders and performing appropriate documentation  Caroleen Hamman, MD FACS General Surgeon 09/16/2022, 7:42 PM

## 2022-09-25 ENCOUNTER — Encounter
Admission: RE | Admit: 2022-09-25 | Discharge: 2022-09-25 | Disposition: A | Discharge status: Home / Self Care | Payer: Medicaid Other | Source: Ambulatory Visit | Attending: Surgery | Admitting: Surgery

## 2022-09-25 ENCOUNTER — Other Ambulatory Visit: Payer: Self-pay

## 2022-09-25 DIAGNOSIS — Z01812 Encounter for preprocedural laboratory examination: Secondary | ICD-10-CM

## 2022-09-25 HISTORY — DX: Pilonidal cyst without abscess: L05.91

## 2022-09-25 NOTE — Patient Instructions (Addendum)
Your procedure is scheduled on: 10/04/22 - Thursday Report to the Registration Desk on the 1st floor of the [facility]. To find out your arrival time, please call [phone] between 1PM - 3PM on: 10/03/22 - Wednesday If your arrival time is 6:00 am, do not arrive prior to that time as the [facility] entrance doors do not open until 6:00 am.  REMEMBER: Instructions that are not followed completely may result in serious medical risk, up to and including death; or upon the discretion of your surgeon and anesthesiologist your surgery may need to be rescheduled.  Do not eat food or drink any liquids after midnight the night before surgery.  No gum chewing, lozengers or hard candies.   TAKE THESE MEDICATIONS THE MORNING OF SURGERY WITH A SIP OF WATER: NONE  One week prior to surgery: Stop Anti-inflammatories (NSAIDS) such as Advil, Aleve, Ibuprofen, Motrin, Naproxen, Naprosyn and Aspirin based products such as Excedrin, Goodys Powder, BC Powder.  Stop ANY OVER THE COUNTER supplements until after surgery.  You may however, continue to take Tylenol if needed for pain up until the day of surgery.  No Alcohol for 24 hours before or after surgery.  No Smoking including e-cigarettes for 24 hours prior to surgery.  No chewable tobacco products for at least 6 hours prior to surgery.  No nicotine patches on the day of surgery.  Do not use any "recreational" drugs for at least a week prior to your surgery.  Please be advised that the combination of cocaine and anesthesia may have negative outcomes, up to and including death. If you test positive for cocaine, your surgery will be cancelled.  On the morning of surgery brush your teeth with toothpaste and water, you may rinse your mouth with mouthwash if you wish. Do not swallow any toothpaste or mouthwash.  Use CHG Soap or wipes as directed on instruction sheet.  Do not wear jewelry, make-up, hairpins, clips or nail polish.  Do not wear  lotions, powders, or perfumes.   Do not shave body from the neck down 48 hours prior to surgery just in case you cut yourself which could leave a site for infection.  Also, freshly shaved skin may become irritated if using the CHG soap.  Contact lenses, hearing aids and dentures may not be worn into surgery.  Do not bring valuables to the hospital. [facility] is not responsible for any missing/lost belongings or valuables.   Notify your doctor if there is any change in your medical condition (cold, fever, infection).  Wear comfortable clothing (specific to your surgery type) to the hospital.  After surgery, you can help prevent lung complications by doing breathing exercises.  Take deep breaths and cough every 1-2 hours. Your doctor may order a device called an Incentive Spirometer to help you take deep breaths. When coughing or sneezing, hold a pillow firmly against your incision with both hands. This is called "splinting." Doing this helps protect your incision. It also decreases belly discomfort.  If you are being admitted to the hospital overnight, leave your suitcase in the car. After surgery it may be brought to your room.  If you are being discharged the day of surgery, you will not be allowed to drive home. You will need a responsible adult (18 years or older) to drive you home and stay with you that night.   If you are taking public transportation, you will need to have a responsible adult (18 years or older) with you. Please  confirm with your physician that it is acceptable to use public transportation.   Please call the [facility] at [phone] if you have any questions about these instructions.  Surgery Visitation Policy:  Patients undergoing a surgery or procedure may have two family members or support persons with them as long as the person is not COVID-19 positive or experiencing its symptoms.   Inpatient Visitation:    Visiting hours are 7 a.m.  to 8 p.m. Up to four visitors are allowed at one time in a patient room. The visitors may rotate out with other people during the day. One designated support person (adult) may remain overnight.  Due to an increase in RSV and influenza rates and associated hospitalizations, children ages 70 and under will not be able to visit patients in [facility] Masks continue to be strongly recommended.

## 2022-10-04 ENCOUNTER — Other Ambulatory Visit: Payer: Self-pay

## 2022-10-04 ENCOUNTER — Encounter
Admission: RE | Disposition: A | Discharge status: Home / Self Care | Payer: Self-pay | Source: Home / Self Care | Attending: Surgery

## 2022-10-04 ENCOUNTER — Ambulatory Visit: Payer: Medicaid Other | Admitting: Anesthesiology

## 2022-10-04 ENCOUNTER — Ambulatory Visit
Admission: RE | Admit: 2022-10-04 | Discharge: 2022-10-04 | Disposition: A | Discharge status: Home / Self Care | Payer: Medicaid Other | Attending: Surgery | Admitting: Surgery

## 2022-10-04 ENCOUNTER — Encounter: Payer: Self-pay | Admitting: Surgery

## 2022-10-04 DIAGNOSIS — Z01812 Encounter for preprocedural laboratory examination: Secondary | ICD-10-CM

## 2022-10-04 DIAGNOSIS — F17210 Nicotine dependence, cigarettes, uncomplicated: Secondary | ICD-10-CM | POA: Insufficient documentation

## 2022-10-04 DIAGNOSIS — L0591 Pilonidal cyst without abscess: Secondary | ICD-10-CM

## 2022-10-04 HISTORY — PX: PILONIDAL CYST EXCISION: SHX744

## 2022-10-04 LAB — POCT PREGNANCY, URINE: Preg Test, Ur: NEGATIVE

## 2022-10-04 SURGERY — EXCISION, PILONIDAL CYST, EXTENSIVE
Anesthesia: General

## 2022-10-04 MED ORDER — DEXMEDETOMIDINE HCL IN NACL 80 MCG/20ML IV SOLN
INTRAVENOUS | Status: DC | PRN
  Administered 2022-10-04: 4 ug via BUCCAL
  Administered 2022-10-04: 8 ug via BUCCAL

## 2022-10-04 MED ORDER — SUGAMMADEX SODIUM 200 MG/2ML IV SOLN
INTRAVENOUS | Status: DC | PRN
  Administered 2022-10-04: 120 mg via INTRAVENOUS

## 2022-10-04 MED ORDER — ACETAMINOPHEN 500 MG PO TABS: 1000.0000 mg | ORAL_TABLET | ORAL | Status: AC

## 2022-10-04 MED ORDER — 0.9 % SODIUM CHLORIDE (POUR BTL) OPTIME
TOPICAL | Status: DC | PRN
  Administered 2022-10-04: 500 mL

## 2022-10-04 MED ORDER — CHLORHEXIDINE GLUCONATE 0.12 % MT SOLN: 15.0000 mL | Freq: Once | OROMUCOSAL | Status: AC

## 2022-10-04 MED ORDER — FENTANYL CITRATE (PF) 100 MCG/2ML IJ SOLN
INTRAMUSCULAR | Status: DC | PRN
  Administered 2022-10-04 (×2): 50 ug via INTRAVENOUS

## 2022-10-04 MED ORDER — PROPOFOL 10 MG/ML IV BOLUS
INTRAVENOUS | Status: DC | PRN
  Administered 2022-10-04: 120 mg via INTRAVENOUS

## 2022-10-04 MED ORDER — FENTANYL CITRATE (PF) 100 MCG/2ML IJ SOLN: 25.0000 ug | INTRAMUSCULAR | Status: DC | PRN

## 2022-10-04 MED ORDER — BUPIVACAINE HCL (PF) 0.25 % IJ SOLN
INTRAMUSCULAR | Status: AC
  Filled 2022-10-04: qty 30

## 2022-10-04 MED ORDER — LACTATED RINGERS IV SOLN: INTRAVENOUS | Status: DC

## 2022-10-04 MED ORDER — LIDOCAINE HCL (CARDIAC) PF 100 MG/5ML IV SOSY
PREFILLED_SYRINGE | INTRAVENOUS | Status: DC | PRN
  Administered 2022-10-04: 60 mg via INTRAVENOUS

## 2022-10-04 MED ORDER — OXYCODONE HCL 5 MG/5ML PO SOLN: 5.0000 mg | Freq: Once | ORAL | Status: DC | PRN

## 2022-10-04 MED ORDER — GABAPENTIN 300 MG PO CAPS
ORAL_CAPSULE | ORAL | Status: AC
  Administered 2022-10-04: 300 mg via ORAL
  Filled 2022-10-04: qty 1

## 2022-10-04 MED ORDER — ROCURONIUM BROMIDE 100 MG/10ML IV SOLN
INTRAVENOUS | Status: DC | PRN
  Administered 2022-10-04: 25 mg via INTRAVENOUS

## 2022-10-04 MED ORDER — GLYCOPYRROLATE 0.2 MG/ML IJ SOLN
INTRAMUSCULAR | Status: DC | PRN
  Administered 2022-10-04: .2 mg via INTRAVENOUS

## 2022-10-04 MED ORDER — MIDAZOLAM HCL 2 MG/2ML IJ SOLN
INTRAMUSCULAR | Status: DC | PRN
  Administered 2022-10-04: 2 mg via INTRAVENOUS

## 2022-10-04 MED ORDER — OXYCODONE HCL 5 MG PO TABS: 5.0000 mg | ORAL_TABLET | Freq: Once | ORAL | Status: DC | PRN

## 2022-10-04 MED ORDER — FAMOTIDINE 20 MG PO TABS: 20.0000 mg | ORAL_TABLET | Freq: Once | ORAL | Status: AC

## 2022-10-04 MED ORDER — MIDAZOLAM HCL 2 MG/2ML IJ SOLN
INTRAMUSCULAR | Status: AC
  Filled 2022-10-04: qty 2

## 2022-10-04 MED ORDER — FENTANYL CITRATE (PF) 100 MCG/2ML IJ SOLN
INTRAMUSCULAR | Status: AC
  Filled 2022-10-04: qty 2

## 2022-10-04 MED ORDER — BUPIVACAINE LIPOSOME 1.3 % IJ SUSP
INTRAMUSCULAR | Status: DC | PRN
  Administered 2022-10-04: 50 mL

## 2022-10-04 MED ORDER — DEXAMETHASONE SODIUM PHOSPHATE 10 MG/ML IJ SOLN
INTRAMUSCULAR | Status: DC | PRN
  Administered 2022-10-04: 6 mg via INTRAVENOUS

## 2022-10-04 MED ORDER — FAMOTIDINE 20 MG PO TABS
ORAL_TABLET | ORAL | Status: AC
  Administered 2022-10-04: 20 mg via ORAL
  Filled 2022-10-04: qty 1

## 2022-10-04 MED ORDER — CHLORHEXIDINE GLUCONATE CLOTH 2 % EX PADS
6.0000 | MEDICATED_PAD | Freq: Once | CUTANEOUS | Status: AC
  Administered 2022-10-04: 6 via TOPICAL

## 2022-10-04 MED ORDER — BUPIVACAINE LIPOSOME 1.3 % IJ SUSP
INTRAMUSCULAR | Status: AC
  Filled 2022-10-04: qty 20

## 2022-10-04 MED ORDER — SODIUM CHLORIDE 0.9 % IV SOLN
2.0000 g | INTRAVENOUS | Status: AC
  Administered 2022-10-04: 2 g via INTRAVENOUS

## 2022-10-04 MED ORDER — ORAL CARE MOUTH RINSE: 15.0000 mL | Freq: Once | OROMUCOSAL | Status: AC

## 2022-10-04 MED ORDER — CHLORHEXIDINE GLUCONATE 0.12 % MT SOLN
OROMUCOSAL | Status: AC
  Administered 2022-10-04: 15 mL via OROMUCOSAL
  Filled 2022-10-04: qty 15

## 2022-10-04 MED ORDER — CHLORHEXIDINE GLUCONATE CLOTH 2 % EX PADS: 6.0000 | MEDICATED_PAD | Freq: Once | CUTANEOUS | Status: DC

## 2022-10-04 MED ORDER — HYDROCODONE-ACETAMINOPHEN 5-325 MG PO TABS: 1.0000 | ORAL_TABLET | ORAL | 0 refills | Status: AC | PRN

## 2022-10-04 MED ORDER — CELECOXIB 200 MG PO CAPS: 200.0000 mg | ORAL_CAPSULE | ORAL | Status: AC

## 2022-10-04 MED ORDER — CELECOXIB 200 MG PO CAPS
ORAL_CAPSULE | ORAL | Status: AC
  Administered 2022-10-04: 200 mg via ORAL
  Filled 2022-10-04: qty 1

## 2022-10-04 MED ORDER — GABAPENTIN 300 MG PO CAPS: 300.0000 mg | ORAL_CAPSULE | ORAL | Status: AC

## 2022-10-04 MED ORDER — ONDANSETRON HCL 4 MG/2ML IJ SOLN
INTRAMUSCULAR | Status: DC | PRN
  Administered 2022-10-04: 4 mg via INTRAVENOUS

## 2022-10-04 MED ORDER — SODIUM CHLORIDE 0.9 % IV SOLN
INTRAVENOUS | Status: AC
  Filled 2022-10-04: qty 2

## 2022-10-04 MED ORDER — PROPOFOL 10 MG/ML IV BOLUS
INTRAVENOUS | Status: AC
  Filled 2022-10-04: qty 20

## 2022-10-04 MED ORDER — ONDANSETRON HCL 4 MG/2ML IJ SOLN: 4.0000 mg | Freq: Once | INTRAMUSCULAR | Status: DC | PRN

## 2022-10-04 MED ORDER — ACETAMINOPHEN 500 MG PO TABS
ORAL_TABLET | ORAL | Status: AC
  Administered 2022-10-04: 1000 mg via ORAL
  Filled 2022-10-04: qty 2

## 2022-10-04 SURGICAL SUPPLY — 36 items
ADH LQ OCL WTPRF AMP STRL LF (MISCELLANEOUS) ×1
ADH SKN CLS APL DERMABOND .7 (GAUZE/BANDAGES/DRESSINGS)
ADHESIVE MASTISOL STRL (MISCELLANEOUS) ×1 IMPLANT
APL PRP STRL LF DISP 70% ISPRP (MISCELLANEOUS) ×1
BANDAGE GAUZE 1X75IN STRL (MISCELLANEOUS) ×1 IMPLANT
BLADE CLIPPER SURG (BLADE) ×1 IMPLANT
BLADE SURG 15 STRL LF DISP TIS (BLADE) ×1 IMPLANT
BLADE SURG 15 STRL SS (BLADE) ×1
BNDG CMPR 75X11 PLY HI ABS (MISCELLANEOUS) ×1
BNDG GAUZE 1X75IN STRL (MISCELLANEOUS) ×1
CHLORAPREP W/TINT 26 (MISCELLANEOUS) ×1 IMPLANT
DERMABOND ADVANCED .7 DNX12 (GAUZE/BANDAGES/DRESSINGS) IMPLANT
DRAPE LAPAROTOMY 77X122 PED (DRAPES) ×1 IMPLANT
ELECT REM PT RETURN 9FT ADLT (ELECTROSURGICAL) ×1
ELECTRODE REM PT RTRN 9FT ADLT (ELECTROSURGICAL) ×1 IMPLANT
GAUZE 4X4 16PLY ~~LOC~~+RFID DBL (SPONGE) ×1 IMPLANT
GAUZE PACKING 0.25INX5YD STRL (GAUZE/BANDAGES/DRESSINGS) IMPLANT
GLOVE BIO SURGEON STRL SZ7 (GLOVE) ×1 IMPLANT
GOWN STRL REUS W/ TWL LRG LVL3 (GOWN DISPOSABLE) ×2 IMPLANT
GOWN STRL REUS W/TWL LRG LVL3 (GOWN DISPOSABLE) ×2
MANIFOLD NEPTUNE II (INSTRUMENTS) ×1 IMPLANT
NEEDLE HYPO 22GX1.5 SAFETY (NEEDLE) ×1 IMPLANT
PACK BASIN MINOR ARMC (MISCELLANEOUS) ×1 IMPLANT
PAD ABD DERMACEA PRESS 5X9 (GAUZE/BANDAGES/DRESSINGS) ×1 IMPLANT
SPONGE T-LAP 18X18 ~~LOC~~+RFID (SPONGE) ×1 IMPLANT
SUT MNCRL 4-0 (SUTURE)
SUT MNCRL 4-0 27XMFL (SUTURE)
SUT VIC AB 3-0 SH 27 (SUTURE)
SUT VIC AB 3-0 SH 27X BRD (SUTURE) IMPLANT
SUTURE MNCRL 4-0 27XMF (SUTURE) IMPLANT
SWAB CULTURE AMIES ANAERIB BLU (MISCELLANEOUS) ×1 IMPLANT
SYR 20ML LL LF (SYRINGE) IMPLANT
SYR BULB IRRIG 60ML STRL (SYRINGE) ×1 IMPLANT
TAPE CLOTH 3X10 WHT NS LF (GAUZE/BANDAGES/DRESSINGS) ×1 IMPLANT
TRAP FLUID SMOKE EVACUATOR (MISCELLANEOUS) ×1 IMPLANT
WATER STERILE IRR 500ML POUR (IV SOLUTION) ×1 IMPLANT

## 2022-10-04 NOTE — Anesthesia Postprocedure Evaluation (Signed)
Anesthesia Post Note  Patient: Pharmacologist  Procedure(s) Performed: CYST EXCISION PILONIDAL EXTENSIVE  Patient location during evaluation: PACU Anesthesia Type: General Level of consciousness: awake and alert Pain management: pain level controlled Vital Signs Assessment: post-procedure vital signs reviewed and stable Respiratory status: spontaneous breathing, nonlabored ventilation, respiratory function stable and patient connected to nasal cannula oxygen Cardiovascular status: blood pressure returned to baseline and stable Postop Assessment: no apparent nausea or vomiting Anesthetic complications: no   No notable events documented.   Last Vitals:  Vitals:   10/04/22 1100 10/04/22 1112  BP:  120/81  Pulse:  78  Resp:  16  Temp: (!) 36.3 C 36.5 C  SpO2:  100%    Last Pain:  Vitals:   10/04/22 1112  TempSrc: Temporal  PainSc: 0-No pain                 Arita Miss

## 2022-10-04 NOTE — Op Note (Signed)
  10/04/2022  10:15 AM  PATIENT:  Jacqueline Maynard  22 y.o. female  PRE-OPERATIVE DIAGNOSIS:  Pilonidal Disease  POST-OPERATIVE DIAGNOSIS:  Same  PROCEDURE:   1. Excision of complex pilonidal disease 2.Excisional debridement of skin subcutaneous tissue and fascia measuring 6cm2 square centimeters    SURGEON:  Surgeon(s) and Role:    * Rossy Virag F, MD - Primary  ANESTHESIA: GETA  FINDINGS: Pilonidal disease complicated w sub q tracking  DICTATION:  Patient was explained about the procedure in detail. Risks, benefits, possible complications and a consent was obtained. The patient taken to the operating room and placed in the prone position. Off midline incision was created and the pilonidal trck was followed. It tracked to the right of the midline and superior to the incision.  There were complex luculations that I was able to lyse with a thesuction device. All the loculations were broken down. Using a sharp curette we debrided the sub q tissue down to  include fascia. Hemostasis was obtained with electrocautery. Irrigation with normal saline and the wound was packed with 1.4-inch packing. Liposomal Marcaine was injected around the wound site. Needle and laparotomy counts were correct and there were no immediate complications  Jules Husbands, MD

## 2022-10-04 NOTE — Anesthesia Preprocedure Evaluation (Signed)
Anesthesia Evaluation  Patient identified by MRN, date of birth, ID band Patient awake    Reviewed: Allergy & Precautions, NPO status , Patient's Chart, lab work & pertinent test results  History of Anesthesia Complications Negative for: history of anesthetic complications  Airway Mallampati: II  TM Distance: >3 FB Neck ROM: Full    Dental no notable dental hx. (+) Teeth Intact   Pulmonary neg sleep apnea, neg COPD, Current SmokerPatient did not abstain from smoking.   Pulmonary exam normal breath sounds clear to auscultation       Cardiovascular Exercise Tolerance: Good METS(-) hypertension(-) CAD and (-) Past MI negative cardio ROS (-) dysrhythmias  Rhythm:Regular Rate:Normal - Systolic murmurs    Neuro/Psych negative neurological ROS  negative psych ROS   GI/Hepatic ,neg GERD  ,,(+)     (-) substance abuse    Endo/Other  neg diabetes    Renal/GU negative Renal ROS     Musculoskeletal   Abdominal   Peds  Hematology   Anesthesia Other Findings Past Medical History: No date: Pilonidal cyst  Reproductive/Obstetrics                              Anesthesia Physical Anesthesia Plan  ASA: 1  Anesthesia Plan: General   Post-op Pain Management:    Induction: Intravenous  PONV Risk Score and Plan: 2 and Ondansetron, Dexamethasone and Midazolam  Airway Management Planned: Oral ETT  Additional Equipment: None  Intra-op Plan:   Post-operative Plan: Extubation in OR  Informed Consent: I have reviewed the patients History and Physical, chart, labs and discussed the procedure including the risks, benefits and alternatives for the proposed anesthesia with the patient or authorized representative who has indicated his/her understanding and acceptance.     Dental advisory given  Plan Discussed with: CRNA and Surgeon  Anesthesia Plan Comments: (Discussed risks of anesthesia with  patient, including PONV, sore throat, lip/dental/eye damage. Rare risks discussed as well, such as cardiorespiratory and neurological sequelae, and allergic reactions. Discussed the role of CRNA in patient's perioperative care. Patient understands. Patient counseled on benefits of smoking cessation, and increased perioperative risks associated with continued smoking. )         Anesthesia Quick Evaluation

## 2022-10-04 NOTE — Discharge Instructions (Addendum)
Pilonidal Cyst Drainage, Care After The following information offers guidance on how to care for yourself after your procedure. Your health care provider may also give you more specific instructions. If you have problems or questions, contact your health care provider. What can I expect after the procedure? After the procedure, it is common to have: Pain that gets better when you take medicine. Some fluid or blood coming from your wound. Follow these instructions at home: Medicines Take over-the-counter and prescription medicines only as told by your health care provider. If you were prescribed antibiotics, take them as told by your health care provider. Do not stop using the antibiotic even if you start to feel better. Ask your health care provider if the medicine prescribed to you: Requires you to avoid driving or using machinery. Can cause constipation. You may need to take these actions to prevent or treat constipation: Drink enough fluid to keep your urine pale yellow. Take over-the-counter or prescription medicines. Eat foods that are high in fiber, such as beans, whole grains, and fresh fruits and vegetables. Limit foods that are high in fat and processed sugars, such as fried or sweet foods. Bathing Do not take baths or showers, swim, or use a hot tub until your health care provider approves. You may only be allowed to take sponge baths. When you can return to bathing will depend on the type of wound that you have. If your wound was packed with a germ-free packing material, keep the area dry until your packing has been removed. After the packing has been removed, you may start taking showers when your health care provider approves. If the edges of the incision around your wound were stitched to your skin (marsupialization), you may start taking showers the day after surgery, or when your health care provider approves. Let the water from the shower moisten your bandage (dressing).  This will make it easier to take off. Remove your dressing before you shower. While bathing, clean your buttocks area gently with soap and water. After bathing: Pat the area dry with a soft, clean towel. If directed, cover the area with a clean dressing. Wound care  You may need to have a caregiver help you with wound care and dressing changes. Follow instructions from your health care provider about how to take care of your wound. Make sure you: Wash your hands with soap and water for at least 20 seconds before and after you change your dressing. If soap and water are not available, use hand sanitizer. Change your dressing daily  and pack it using 1/4 inch packing , may cover it w plain gauze and tape. . Check your wound every day for signs of infection. Check for: Redness, swelling, or more pain. More fluid or blood. Warmth. Pus or a bad smell. Follow any additional instructions from your health care provider on how to care for your wound, such as wound cleaning, wound flushing (irrigation), or packing your wound with a dressing. If you had marsupialization, ask your health care provider when you can stop using a dressing. Lifestyle Do not do activities that irritate or put pressure on your buttocks for about 2 weeks, or as long as told by your health care provider. These activities include bike riding, running, and anything that involves a twisting motion. Rest as told by your health care provider. Do not sit for a long time without moving. Get up to take short walks every 1-2 hours. This will improve blood flow and  breathing. Ask for help if you feel weak or unsteady. Sleep on your side instead of your back. Avoid wearing tight underwear and tight pants. Return to your normal activities as told by your health care provider. Ask your health care provider what activities are safe for you. General instructions Do not use any products that contain nicotine or tobacco. These products  include cigarettes, chewing tobacco, and vaping devices, such as e-cigarettes. These can delay wound healing after surgery. If you need help quitting, ask your health care provider. Keep all follow-up visits. This is important to monitor healing. If you had an incision and drainage procedure with wound packing, your packing may be changed or removed at follow-up visits. Contact a health care provider if: You have pain that does not get better with medicine. You have any of these signs of infection: More redness, swelling, or pain around your incision. More fluid or blood coming from your incision. Warmth coming from your incision. Pus or a bad smell coming from your incision. A fever. You have muscle aches. You feel generally sick. You are dizzy. Summary A pilonidal cyst is a fluid-filled sac that forms under the skin near the tailbone. It is common to have some fluid or blood coming from your wound after a procedure to drain a pilonidal cyst. If you were prescribed antibiotics, take them as told by your health care provider. Do not stop taking the antibiotic even if you start to feel better. You may need to have a caregiver help you with wound care and dressing changes. Return to your health care provider as instructed to have any packing material changed or removed. This information is not intended to replace advice given to you by your health care provider. Make sure you discuss any questions you have with your health care provider. Document Revised: 11/08/2021 Document Reviewed: 11/08/2021 Elsevier Patient Education  [place]   The drugs that you were given will stay in your system until tomorrow so for the next 24 hours you should not:  Drive an automobile Make any legal decisions Drink any alcoholic beverage   You may resume regular meals tomorrow.  Today it is better to start with liquids and  gradually work up to solid foods.  You may eat anything you prefer, but it is better to start with liquids, then soup and crackers, and gradually work up to solid foods.   Please notify your doctor immediately if you have any unusual bleeding, trouble breathing, redness and pain at the surgery site, drainage, fever, or pain not relieved by medication.    Additional Instructions: PLEASE LEAVE GREEN TEAL ARMBAND ON FOR 4 DAYS    Please contact your physician with any problems or Same Day Surgery at [phone], Monday through Friday 6 am to 4 pm, or [place] at [facility] number at [phone].

## 2022-10-04 NOTE — Transfer of Care (Signed)
Immediate Anesthesia Transfer of Care Note  Patient: Pharmacologist  Procedure(s) Performed: CYST EXCISION PILONIDAL EXTENSIVE  Patient Location: PACU  Anesthesia Type:General  Level of Consciousness: awake, alert , and oriented  Airway & Oxygen Therapy: Patient Spontanous Breathing and Patient connected to face mask oxygen  Post-op Assessment: Report given to RN and Post -op Vital signs reviewed and stable  Post vital signs: Reviewed and stable  Last Vitals:  Vitals Value Taken Time  BP 127/91 10/04/22 1032  Temp 36.1 C 10/04/22 1032  Pulse 71 10/04/22 1035  Resp 20 10/04/22 1035  SpO2 100 % 10/04/22 1035  Vitals shown include unvalidated device data.  Last Pain:  Vitals:   10/04/22 0857  TempSrc: Temporal  PainSc: 2          Complications: No notable events documented.

## 2022-10-04 NOTE — Anesthesia Procedure Notes (Signed)
Procedure Name: Intubation Date/Time: 10/04/2022 9:35 AM  Performed by: Aline Brochure, CRNAPre-anesthesia Checklist: Patient identified, Patient being monitored, Timeout performed, Emergency Drugs available and Suction available Patient Re-evaluated:Patient Re-evaluated prior to induction Oxygen Delivery Method: Circle system utilized Preoxygenation: Pre-oxygenation with 100% oxygen Induction Type: IV induction Ventilation: Mask ventilation without difficulty Laryngoscope Size: 3 and McGraph Grade View: Grade I Tube type: Oral Tube size: 6.5 mm Number of attempts: 1 Airway Equipment and Method: Stylet and Video-laryngoscopy Placement Confirmation: ETT inserted through vocal cords under direct vision, positive ETCO2 and breath sounds checked- equal and bilateral Secured at: 21 cm Tube secured with: Tape Dental Injury: Teeth and Oropharynx as per pre-operative assessment

## 2022-10-04 NOTE — Interval H&P Note (Signed)
History and Physical Interval Note:  10/04/2022 8:59 AM  Jacqueline Maynard  has presented today for surgery, with the diagnosis of pilonidal cyst.  The various methods of treatment have been discussed with the patient and family. After consideration of risks, benefits and other options for treatment, the patient has consented to  Procedure(s): CYST EXCISION PILONIDAL EXTENSIVE (N/A) as a surgical intervention.  The patient's history has been reviewed, patient examined, no change in status, stable for surgery.  I have reviewed the patient's chart and labs.  Questions were answered to the patient's satisfaction.     [place]

## 2022-10-05 ENCOUNTER — Encounter: Payer: Self-pay | Admitting: Surgery

## 2022-10-05 LAB — SURGICAL PATHOLOGY

## 2022-10-11 ENCOUNTER — Encounter: Payer: Self-pay | Admitting: Surgery

## 2022-10-15 ENCOUNTER — Encounter: Payer: Medicaid Other | Admitting: Surgery

## 2023-05-30 ENCOUNTER — Other Ambulatory Visit: Payer: Self-pay

## 2023-05-30 ENCOUNTER — Emergency Department
Admission: EM | Admit: 2023-05-30 | Discharge: 2023-05-30 | Disposition: A | Discharge status: Home / Self Care | Payer: MEDICAID | Attending: Emergency Medicine | Admitting: Emergency Medicine

## 2023-05-30 DIAGNOSIS — L0591 Pilonidal cyst without abscess: Secondary | ICD-10-CM | POA: Diagnosis present

## 2023-05-30 MED ORDER — LIDOCAINE-EPINEPHRINE 2 %-1:100000 IJ SOLN
20.0000 mL | Freq: Once | INTRAMUSCULAR | Status: AC
  Administered 2023-05-30: 20 mL via INTRADERMAL
  Filled 2023-05-30: qty 1

## 2023-05-30 MED ORDER — AMOXICILLIN-POT CLAVULANATE 875-125 MG PO TABS: 1.0000 | ORAL_TABLET | Freq: Two times a day (BID) | ORAL | 0 refills | Status: AC

## 2023-05-30 NOTE — ED Provider Notes (Signed)
[facility] Provider Note    Event Date/Time   First MD Initiated Contact with Patient 05/30/23 1153     (approximate)   History   No chief complaint on file.   HPI  Jacqueline Maynard is a 22 y.o. female with a past medical history of pilonidal cyst who presents today with recurrence of her pilonidal cyst.  Patient reports that this happened to her last year and she had it incised and drained and asked what she is requesting today.  No fevers or chills.  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          Physical Exam   Triage Vital Signs: ED Triage Vitals  Encounter Vitals Group     BP 05/30/23 1138 138/86     Systolic BP Percentile --      Diastolic BP Percentile --      Pulse Rate 05/30/23 1138 (!) 120     Resp 05/30/23 1138 16     Temp 05/30/23 1138 99.2 F (37.3 C)     Temp Source 05/30/23 1138 Oral     SpO2 05/30/23 1138 100 %     Weight 05/30/23 1136 136 lb 0.4 oz (61.7 kg)     Height 05/30/23 1136 5\' 2"  (1.575 m)     Head Circumference --      Peak Flow --      Pain Score 05/30/23 1136 10     Pain Loc --      Pain Education --      Exclude from Growth Chart --     Most recent vital signs: Vitals:   05/30/23 1258 05/30/23 1259  BP: 124/70   Pulse: (!) 101   Resp: 16   Temp:  99.2 F (37.3 C)  SpO2: 100%     Physical Exam Vitals and nursing note reviewed.  Constitutional:      General: Awake and alert. No acute distress.    Appearance: Normal appearance. The patient is normal weight.  HENT:     Head: Normocephalic and atraumatic.     Mouth: Mucous membranes are moist.  Eyes:     General: PERRL. Normal EOMs        Right eye: No discharge.        Left eye: No discharge.     Conjunctiva/sclera: Conjunctivae normal.  Cardiovascular:     Rate and Rhythm: Normal rate and regular rhythm.     Pulses: Normal pulses.  Pulmonary:     Effort: Pulmonary effort is normal.  No respiratory distress.     Breath sounds: Normal breath sounds.  Abdominal:     Abdomen is soft. There is no abdominal tenderness. No rebound or guarding. No distention. Indurated tissue noted to the superior aspect of the gluteal cleft, with well-healed incision noted.  No erythema.  Tender to palpation without palpable area of fluctuance. Musculoskeletal:        General: No swelling. Normal range of motion.     Cervical back: Normal range of motion and neck supple.  Skin:    General: Skin is warm and dry.     Capillary Refill: Capillary refill takes less than 2 seconds.     Findings: No rash.  Neurological:     Mental Status: The patient is awake and alert.      ED Results / Procedures / Treatments   Labs (all labs ordered are listed, but only abnormal results  are displayed) Labs Reviewed - No data to display   EKG     RADIOLOGY     PROCEDURES:  Critical Care performed:   Marland KitchenMarland KitchenIncision and Drainage  Date/Time: 05/30/2023 2:33 PM  Performed by: Jackelyn Hoehn, PA-C Authorized by: Jackelyn Hoehn, PA-C   Consent:    Consent obtained:  Verbal   Consent given by:  Patient   Risks, benefits, and alternatives were discussed: yes     Risks discussed:  Bleeding, incomplete drainage, pain, damage to other organs and infection   Alternatives discussed:  No treatment Universal protocol:    Procedure explained and questions answered to patient or proxy's satisfaction: yes     Relevant documents present and verified: yes     Test results available : yes     Required blood products, implants, devices, and special equipment available: yes     Site/side marked: yes     Immediately prior to procedure, a time out was called: yes     Patient identity confirmed:  Verbally with patient Location:    Type:  Pilonidal cyst   Location:  Anogenital   Anogenital location:  Pilonidal Pre-procedure details:    Skin preparation:  Betadine Sedation:    Sedation type:   None Anesthesia:    Anesthesia method:  Local infiltration   Local anesthetic:  Lidocaine 1% WITH epi Procedure type:    Complexity:  Simple Procedure details:    Incision types:  Single straight   Incision depth:  Subcutaneous   Wound management:  Probed and deloculated, irrigated with saline and extensive cleaning   Drainage:  Serous   Drainage amount:  Scant   Wound treatment:  Wound left open Post-procedure details:    Procedure completion:  Tolerated well, no immediate complications    MEDICATIONS ORDERED IN ED: Medications  lidocaine-EPINEPHrine (XYLOCAINE W/EPI) 2 %-1:100000 (with pres) injection 20 mL (20 mLs Intradermal Given 05/30/23 1218)     IMPRESSION / MDM / ASSESSMENT AND PLAN / ED COURSE  I reviewed the triage vital signs and the nursing notes.   Differential diagnosis includes, but is not limited to, pilonidal cyst, pilonidal abscess, cellulitis, scar tissue.  Patient is awake and alert, hemodynamically stable and afebrile.  She is nontoxic in appearance.  She was tachycardic on arrival, though this was rechecked without intervention and has significantly improved.    I reviewed the patient's chart.  Patient had pilonidal cyst excision surgery on 10/04/2022.  She has an obvious area of scar tissue, though there is no erythema or fluctuance.  She is requesting attempt at incision and drainage.  Very small amount of serous fluid was expressed, with significant improvement of her symptoms.  There is not appear to be any purulent fluid.  There was no tenderness or fluctuance or erythema inferiorly to suggest perirectal or perianal abscess.  She was started on antibiotics.  Recommended close outpatient follow-up with Dr. Elenor Legato who performed her surgery.  We discussed return precautions and outpatient follow-up.  Patient understands and agrees with plan.  She was discharged in stable condition.   Patient's presentation is most consistent with acute complicated illness /  injury requiring diagnostic workup.   FINAL CLINICAL IMPRESSION(S) / ED DIAGNOSES   Final diagnoses:  Pilonidal cyst     Rx / DC Orders   ED Discharge Orders          Ordered    amoxicillin-clavulanate (AUGMENTIN) 875-125 MG tablet  2 times daily  05/30/23 1313             Note:  This document was prepared using Dragon voice recognition software and may include unintentional dictation errors.   Keturah Shavers 05/30/23 1434    Sharman Cheek, MD 05/30/23 [phone]

## 2023-05-30 NOTE — Discharge Instructions (Addendum)
Take the antibiotics as prescribed.  Please follow-up with Dr. Everlene Farrier.  Please return for any new, worsening, or change in symptoms or other concerns.  It was a pleasure caring for you today.

## 2023-05-30 NOTE — ED Triage Notes (Signed)
Arrives C/O pilonidal cyst flare up x 2-3 days ago. Last year had same.

## 2023-06-02 ENCOUNTER — Emergency Department
Admission: EM | Admit: 2023-06-02 | Discharge: 2023-06-02 | Disposition: A | Discharge status: Home / Self Care | Payer: MEDICAID | Attending: Emergency Medicine | Admitting: Emergency Medicine

## 2023-06-02 ENCOUNTER — Other Ambulatory Visit: Payer: Self-pay

## 2023-06-02 ENCOUNTER — Encounter: Payer: Self-pay | Admitting: Emergency Medicine

## 2023-06-02 DIAGNOSIS — L0591 Pilonidal cyst without abscess: Secondary | ICD-10-CM | POA: Insufficient documentation

## 2023-06-02 MED ORDER — ONDANSETRON HCL 4 MG PO TABS: 4.0000 mg | ORAL_TABLET | Freq: Two times a day (BID) | ORAL | 0 refills | Status: AC | PRN

## 2023-06-02 NOTE — ED Notes (Signed)
See triage note  Presents with possible pilonidal cyst  States she was here 3 days ago  Was placed on antibiotics  States  is not able to take they  make her scik  States pain is getting worse

## 2023-06-02 NOTE — ED Provider Notes (Signed)
[facility] Provider Note    Event Date/Time   First MD Initiated Contact with Patient 06/02/23 1122     (approximate)   History   Abscess   HPI Jacqueline Maynard is a 22 y.o. female with history of pilonidal cyst presenting today for abscess.  Patient reports she was in the emergency department 3 days ago for recurrent pilonidal cyst.  She did undergo incision and drainage at that time and was discharged on antibiotic.  She states she has been intermittently throwing up while taking the antibiotic and not been able to complete every course.  She still has some pain in the area but has noticed drainage as well.  Denies any fevers or chills.     Physical Exam   Triage Vital Signs: ED Triage Vitals  Encounter Vitals Group     BP 06/02/23 1107 (!) 156/99     Systolic BP Percentile --      Diastolic BP Percentile --      Pulse Rate 06/02/23 1107 (!) 114     Resp 06/02/23 1107 20     Temp 06/02/23 1107 98 F (36.7 C)     Temp Source 06/02/23 1107 Oral     SpO2 06/02/23 1107 98 %     Weight 06/02/23 1105 132 lb (59.9 kg)     Height 06/02/23 1105 5' (1.524 m)     Head Circumference --      Peak Flow --      Pain Score 06/02/23 1105 10     Pain Loc --      Pain Education --      Exclude from Growth Chart --     Most recent vital signs: Vitals:   06/02/23 1107  BP: (!) 156/99  Pulse: (!) 114  Resp: 20  Temp: 98 F (36.7 C)  SpO2: 98%   I have reviewed the vital signs. General:  Awake, alert, no acute distress. Head:  Normocephalic, Atraumatic. EENT:  PERRL, EOMI, Oral mucosa pink and moist, Neck is supple. Cardiovascular: Regular rate, 2+ distal pulses. Respiratory:  Normal respiratory effort, symmetrical expansion, no distress.   Extremities:  Moving all four extremities through full ROM without pain.   GU: examined area of pilonidal cyst without obvious fluctuance present at this time.  Previous incision noted.  Mild erythema. Neuro:  Alert  and oriented.  Interacting appropriately.   Skin:  Warm, dry, no rash.   Psych: Appropriate affect.     ED Results / Procedures / Treatments   Labs (all labs ordered are listed, but only abnormal results are displayed) Labs Reviewed - No data to display   EKG    RADIOLOGY    PROCEDURES:  Critical Care performed: No  Procedures   MEDICATIONS ORDERED IN ED: Medications - No data to display   IMPRESSION / MDM / ASSESSMENT AND PLAN / ED COURSE  I reviewed the triage vital signs and the nursing notes.                              Differential diagnosis includes, but is not limited to, pilonidal cyst, cellulitis.  Patient's presentation is most consistent with acute, uncomplicated illness.  Patient is a 22 year old female presenting today for inability to keep medications down following pilonidal cyst drainage.  Small amount of erythema noted around the site with slight tenderness but no obvious fluctuance.  I do not believe she needs repeat  I&D today given that was done 3 days ago.  Patient is on Augmentin and will prescribe her with Zofran to help her tolerate antibiotic treatment.  Once again emphasized following up with the general surgeon as this is required surgery outpatient in the past.  Patient is agreeable with this plan and given strict return precautions.      FINAL CLINICAL IMPRESSION(S) / ED DIAGNOSES   Final diagnoses:  Pilonidal cyst     Rx / DC Orders   ED Discharge Orders          Ordered    ondansetron (ZOFRAN) 4 MG tablet  2 times daily PRN        06/02/23 1148             Note:  This document was prepared using Dragon voice recognition software and may include unintentional dictation errors.   Janith Lima, MD 06/02/23 1149

## 2023-06-02 NOTE — ED Triage Notes (Signed)
Pt via POV from home. Pt c/o abscess. States she was seen here for same 3 days ago. States that it was drained but the medication she was prescribed caused her to vomit so she only has taken a couple of doses. Denies fever. Pt is A&Ox4 and NAD, ambulatory to triage.

## 2023-06-02 NOTE — Discharge Instructions (Signed)
Please pick up the nausea medication from the pharmacy to help with taking your antibiotics.  Take it 30 minutes prior to antibiotic use.  Please follow-up with the surgeon as planned for ongoing outpatient care.

## 2023-06-06 ENCOUNTER — Other Ambulatory Visit: Payer: Self-pay

## 2023-06-06 DIAGNOSIS — R55 Syncope and collapse: Secondary | ICD-10-CM | POA: Insufficient documentation

## 2023-06-06 DIAGNOSIS — R110 Nausea: Secondary | ICD-10-CM | POA: Diagnosis not present

## 2023-06-06 DIAGNOSIS — R42 Dizziness and giddiness: Secondary | ICD-10-CM | POA: Insufficient documentation

## 2023-06-06 LAB — COMPREHENSIVE METABOLIC PANEL
ALT: 21 U/L (ref 0–44)
AST: 31 U/L (ref 15–41)
Albumin: 3.6 g/dL (ref 3.5–5.0)
Alkaline Phosphatase: 123 U/L (ref 38–126)
Anion gap: 17 — ABNORMAL HIGH (ref 5–15)
BUN: 11 mg/dL (ref 6–20)
CO2: 24 mmol/L (ref 22–32)
Calcium: 9.1 mg/dL (ref 8.9–10.3)
Chloride: 96 mmol/L — ABNORMAL LOW (ref 98–111)
Creatinine, Ser: 0.93 mg/dL (ref 0.44–1.00)
GFR, Estimated: >60 mL/min (ref >60)
Glucose, Bld: 106 mg/dL — ABNORMAL HIGH (ref 70–99)
Potassium: 3 mmol/L — ABNORMAL LOW (ref 3.5–5.1)
Sodium: 137 mmol/L (ref 135–145)
Total Bilirubin: 0.3 mg/dL (ref 0.3–1.2)
Total Protein: 7.8 g/dL (ref 6.5–8.1)

## 2023-06-06 LAB — CBC
HCT: 36.3 % (ref 36.0–46.0)
Hemoglobin: 11.3 g/dL — ABNORMAL LOW (ref 12.0–15.0)
MCH: 25.9 pg — ABNORMAL LOW (ref 26.0–34.0)
MCHC: 31.1 g/dL (ref 30.0–36.0)
MCV: 83.3 fL (ref 80.0–100.0)
Platelets: 593 10*3/uL — ABNORMAL HIGH (ref 150–400)
RBC: 4.36 MIL/uL (ref 3.87–5.11)
RDW: 12.9 % (ref 11.5–15.5)
WBC: 14.8 10*3/uL — ABNORMAL HIGH (ref 4.0–10.5)
nRBC: 0 % (ref 0.0–0.2)

## 2023-06-06 MED ORDER — LACTATED RINGERS IV BOLUS
1000.0000 mL | Freq: Once | INTRAVENOUS | Status: AC
  Administered 2023-06-06: 1000 mL via INTRAVENOUS

## 2023-06-06 NOTE — ED Triage Notes (Addendum)
Pt reports abscess to rectum that was drained recently. States after that it became swollen and then popped on its own. States blood and pus drainage since then. Pt now reporting SOB and near syncope. Pt also does endorse some anxiety but states only onset after the near syncopal episodes. Pt alert and oriented in triage. Pt tachypneic but breathing unlabored and speaking in full sentences with symmetric chest rise and fall. Pt reports has not been taking the prescribed amoxicillin d/t associated nausea/vomiting with it.

## 2023-06-07 ENCOUNTER — Emergency Department
Admission: EM | Admit: 2023-06-07 | Discharge: 2023-06-07 | Disposition: A | Discharge status: Home / Self Care | Payer: MEDICAID | Attending: Emergency Medicine | Admitting: Emergency Medicine

## 2023-06-07 DIAGNOSIS — Z5189 Encounter for other specified aftercare: Secondary | ICD-10-CM

## 2023-06-07 DIAGNOSIS — R42 Dizziness and giddiness: Secondary | ICD-10-CM

## 2023-06-07 LAB — URINALYSIS, ROUTINE W REFLEX MICROSCOPIC
Bilirubin Urine: NEGATIVE
Glucose, UA: NEGATIVE mg/dL
Ketones, ur: NEGATIVE mg/dL
Nitrite: NEGATIVE
Protein, ur: NEGATIVE mg/dL
Specific Gravity, Urine: 1.002 — ABNORMAL LOW (ref 1.005–1.030)
pH: 7 (ref 5.0–8.0)

## 2023-06-07 LAB — POC URINE PREG, ED: Preg Test, Ur: NEGATIVE

## 2023-06-07 MED ORDER — ONDANSETRON HCL 4 MG/2ML IJ SOLN
4.0000 mg | Freq: Once | INTRAMUSCULAR | Status: AC
  Administered 2023-06-07: 4 mg via INTRAVENOUS
  Filled 2023-06-07: qty 2

## 2023-06-07 MED ORDER — AMOXICILLIN-POT CLAVULANATE 875-125 MG PO TABS
1.0000 | ORAL_TABLET | Freq: Once | ORAL | Status: AC
  Administered 2023-06-07: 1 via ORAL
  Filled 2023-06-07: qty 1

## 2023-06-07 MED ORDER — ONDANSETRON 4 MG PO TBDP: 4.0000 mg | ORAL_TABLET | Freq: Three times a day (TID) | ORAL | 0 refills | Status: AC | PRN

## 2023-06-07 NOTE — ED Provider Notes (Signed)
[facility] Provider Note    Event Date/Time   First MD Initiated Contact with Patient 06/07/23 0122     (approximate)   History   Near Syncope and Wound Check   HPI  Jacqueline Maynard is a 22 y.o. female who presents to the ED for evaluation of Near Syncope and Wound Check   I reviewed ED visits from 10/3 and 10/6.  Seen for pilonidal cyst on both occasions.  Has been prescribed Augmentin for this, then subsequently prescribed Zofran due to some nausea and poor tolerance of the Augmentin due to this.  She presents to the ED alongside her mother for evaluation of continued nausea and intolerance of her Augmentin as well as some presyncopal dizziness that has since improved.  They report that they have not yet been able to pick up the Zofran prescription due to transportation logistics therefore patient has poorly been tolerating her Augmentin.   Physical Exam   Triage Vital Signs: ED Triage Vitals  Encounter Vitals Group     BP 06/06/23 2241 129/79     Systolic BP Percentile --      Diastolic BP Percentile --      Pulse Rate 06/06/23 2241 (!) 115     Resp 06/06/23 2241 (!) 21     Temp 06/06/23 2241 98.6 F (37 C)     Temp Source 06/06/23 2241 Oral     SpO2 06/06/23 2241 99 %     Weight 06/06/23 2240 132 lb (59.9 kg)     Height 06/06/23 2240 5\' 2"  (1.575 m)     Head Circumference --      Peak Flow --      Pain Score 06/06/23 2239 3     Pain Loc --      Pain Education --      Exclude from Growth Chart --     Most recent vital signs: Vitals:   06/06/23 2241  BP: 129/79  Pulse: (!) 115  Resp: (!) 21  Temp: 98.6 F (37 C)  SpO2: 99%    General: Awake, no distress.  CV:  Good peripheral perfusion.  Resp:  Normal effort.  Abd:  No distention.  MSK:  No deformity noted.  Neuro:  No focal deficits appreciated. Other:  Healing pilonidal cyst to the superior aspect of the gluteal cleft with good granulation tissue without evidence of  superimposed cellulitis.  No purulence with expression   ED Results / Procedures / Treatments   Labs (all labs ordered are listed, but only abnormal results are displayed) Labs Reviewed  CBC - Abnormal; Notable for the following components:      Result Value   WBC 14.8 (*)    Hemoglobin 11.3 (*)    MCH 25.9 (*)    Platelets 593 (*)    All other components within normal limits  URINALYSIS, ROUTINE W REFLEX MICROSCOPIC - Abnormal; Notable for the following components:   Color, Urine STRAW (*)    APPearance CLEAR (*)    Specific Gravity, Urine 1.002 (*)    Hgb urine dipstick SMALL (*)    Leukocytes,Ua MODERATE (*)    Bacteria, UA FEW (*)    All other components within normal limits  COMPREHENSIVE METABOLIC PANEL - Abnormal; Notable for the following components:   Potassium 3.0 (*)    Chloride 96 (*)    Glucose, Bld 106 (*)    Anion gap 17 (*)    All other components within normal limits  CBG MONITORING, ED  POC URINE PREG, ED    EKG Sinus tachycardia with rate of 109 bpm.  Normal axis and intervals.  No clear signs of acute ischemia.  Nonspecific ST changes to lead III.  RADIOLOGY   Official radiology report(s): No results found.  PROCEDURES and INTERVENTIONS:  Procedures  Medications  lactated ringers bolus 1,000 mL (0 mLs Intravenous Stopped 06/07/23 0033)     IMPRESSION / MDM / ASSESSMENT AND PLAN / ED COURSE  I reviewed the triage vital signs and the nursing notes.  Differential diagnosis includes, but is not limited to, dehydration, AKI, sepsis, vasovagal episode, cellulitis, cardiac dysrhythmia  {Patient presents with symptoms of an acute illness or injury that is potentially life-threatening.  Patient presents for a spell of dizziness in the setting of poor toleration of antibiotics to treat a pilonidal cyst.  She looks well to me after receiving triage IV fluids and has no symptoms.  Tachycardia is resolved.  Wound is healing without signs of persistence  or superimposed infection.  Provided Zofran and a dose of her Augmentin which is well-tolerated.  We discussed transportation and logistic options to pick up the Zofran, which they think they will be able to do in the morning.  Discussed finishing her Augmentin prescription as well as appropriate turn precautions for the ED.  Patient is suitable for outpatient management.      FINAL CLINICAL IMPRESSION(S) / ED DIAGNOSES   Final diagnoses:  None     Rx / DC Orders   ED Discharge Orders     None        Note:  This document was prepared using Dragon voice recognition software and may include unintentional dictation errors.   Delton Prairie, MD 06/07/23 0500

## 2023-06-07 NOTE — Discharge Instructions (Signed)
Before work, in the morning, please go to the pharmacy to pick up the nausea medicine (Zofran) - Use this medicine right before you take your antibiotics to help keep them down.   If your symptoms worsen despite this, please return to the ED.   Finish your antibiotic prescription

## 2023-06-10 ENCOUNTER — Ambulatory Visit: Payer: MEDICAID | Admitting: Surgery

## 2023-12-19 ENCOUNTER — Encounter: Payer: Self-pay | Admitting: Emergency Medicine

## 2023-12-19 ENCOUNTER — Emergency Department
Admission: EM | Admit: 2023-12-19 | Discharge: 2023-12-19 | Disposition: A | Discharge status: Home / Self Care | Payer: MEDICAID | Attending: Emergency Medicine | Admitting: Emergency Medicine

## 2023-12-19 ENCOUNTER — Other Ambulatory Visit: Payer: Self-pay

## 2023-12-19 DIAGNOSIS — F1590 Other stimulant use, unspecified, uncomplicated: Secondary | ICD-10-CM | POA: Insufficient documentation

## 2023-12-19 DIAGNOSIS — Y908 Blood alcohol level of 240 mg/100 ml or more: Secondary | ICD-10-CM | POA: Diagnosis not present

## 2023-12-19 DIAGNOSIS — F10129 Alcohol abuse with intoxication, unspecified: Secondary | ICD-10-CM | POA: Diagnosis not present

## 2023-12-19 DIAGNOSIS — R1110 Vomiting, unspecified: Secondary | ICD-10-CM | POA: Diagnosis present

## 2023-12-19 DIAGNOSIS — F10920 Alcohol use, unspecified with intoxication, uncomplicated: Secondary | ICD-10-CM

## 2023-12-19 LAB — CBC
HCT: 39.4 % (ref 36.0–46.0)
Hemoglobin: 11.9 g/dL — ABNORMAL LOW (ref 12.0–15.0)
MCH: 25.2 pg — ABNORMAL LOW (ref 26.0–34.0)
MCHC: 30.2 g/dL (ref 30.0–36.0)
MCV: 83.5 fL (ref 80.0–100.0)
Platelets: 356 10*3/uL (ref 150–400)
RBC: 4.72 MIL/uL (ref 3.87–5.11)
RDW: 15.4 % (ref 11.5–15.5)
WBC: 10.6 10*3/uL — ABNORMAL HIGH (ref 4.0–10.5)
nRBC: 0 % (ref 0.0–0.2)

## 2023-12-19 LAB — COMPREHENSIVE METABOLIC PANEL WITH GFR
ALT: 16 U/L (ref 0–44)
AST: 20 U/L (ref 15–41)
Albumin: 5.1 g/dL — ABNORMAL HIGH (ref 3.5–5.0)
Alkaline Phosphatase: 90 U/L (ref 38–126)
Anion gap: 12 (ref 5–15)
BUN: 6 mg/dL (ref 6–20)
CO2: 24 mmol/L (ref 22–32)
Calcium: 9.6 mg/dL (ref 8.9–10.3)
Chloride: 110 mmol/L (ref 98–111)
Creatinine, Ser: 0.79 mg/dL (ref 0.44–1.00)
GFR, Estimated: >60 mL/min (ref >60)
Glucose, Bld: 113 mg/dL — ABNORMAL HIGH (ref 70–99)
Potassium: 4.1 mmol/L (ref 3.5–5.1)
Sodium: 146 mmol/L — ABNORMAL HIGH (ref 135–145)
Total Bilirubin: 0.4 mg/dL (ref 0.0–1.2)
Total Protein: 8.9 g/dL — ABNORMAL HIGH (ref 6.5–8.1)

## 2023-12-19 LAB — ACETAMINOPHEN LEVEL: Acetaminophen (Tylenol), Serum: <10 ug/mL — ABNORMAL LOW (ref 10–30)

## 2023-12-19 LAB — URINE DRUG SCREEN, QUALITATIVE (ARMC ONLY)
Amphetamines, Ur Screen: POSITIVE — AB
Barbiturates, Ur Screen: NOT DETECTED
Benzodiazepine, Ur Scrn: NOT DETECTED
Cannabinoid 50 Ng, Ur ~~LOC~~: POSITIVE — AB
Cocaine Metabolite,Ur ~~LOC~~: NOT DETECTED
MDMA (Ecstasy)Ur Screen: NOT DETECTED
Methadone Scn, Ur: NOT DETECTED
Opiate, Ur Screen: NOT DETECTED
Phencyclidine (PCP) Ur S: NOT DETECTED
Tricyclic, Ur Screen: NOT DETECTED

## 2023-12-19 LAB — ETHANOL: Alcohol, Ethyl (B): 246 mg/dL — ABNORMAL HIGH (ref <15)

## 2023-12-19 LAB — POC URINE PREG, ED: Preg Test, Ur: NEGATIVE

## 2023-12-19 LAB — SALICYLATE LEVEL: Salicylate Lvl: <7 mg/dL — ABNORMAL LOW (ref 7.0–30.0)

## 2023-12-19 NOTE — ED Triage Notes (Addendum)
 Pt via [employer] from jail. Pt under police custody. Pt here for medical clearance. States she has been drinking today she states unknown how much and states she ingested a party drug called a "yart" unknown what is in it. The jail sent her over here because she vomited once and her vital signs were abnormal, denies any nausea now. Pt is cooperative during triage but speaking loudly

## 2023-12-19 NOTE — ED Provider Notes (Signed)
 [facility] Provider Note    Event Date/Time   First MD Initiated Contact with Patient 12/19/23 1326     (approximate)   History   Chief Complaint Medical Clearance   HPI  Jacqueline Maynard is a 23 y.o. female with no significant past medical history who presents to the ED for medical clearance.  Per [employer] officer, patient was found having broken into a home through a window, was laying down on the bed when police encountered her.  She quickly woke up with the arrival of police, was brought to jail where she seemed to be acting erratically and had 1 episode of vomiting.  She was subsequently sent to the ED for medical clearance.  Patient states that she took a dose of "yard" about an hour ago, but is not sure what is in the drug.  She does admit to alcohol consumption, denies taking anything other than the "yard."  Per local PD, this is known to be a handmade drug that can include amphetamines, synthetic marijuana, cocaine, or other substances.  Patient states that she has taken this drug before without issues.  Patient currently denies any complaints, denies nausea or abdominal pain.     Physical Exam   Triage Vital Signs: ED Triage Vitals  Encounter Vitals Group     BP 12/19/23 1256 (!) 130/90     Systolic BP Percentile --      Diastolic BP Percentile --      Pulse Rate 12/19/23 1256 87     Resp 12/19/23 1256 18     Temp 12/19/23 1256 97.7 F (36.5 C)     Temp Source 12/19/23 1256 Oral     SpO2 12/19/23 1256 100 %     Weight --      Height 12/19/23 1252 5\' 2"  (1.575 m)     Head Circumference --      Peak Flow --      Pain Score 12/19/23 1252 0     Pain Loc --      Pain Education --      Exclude from Growth Chart --     Most recent vital signs: Vitals:   12/19/23 1256  BP: (!) 130/90  Pulse: 87  Resp: 18  Temp: 97.7 F (36.5 C)  SpO2: 100%    Constitutional: Alert and oriented. Eyes: Conjunctivae are normal. Head: Atraumatic. Nose: No  congestion/rhinnorhea. Mouth/Throat: Mucous membranes are moist.  Cardiovascular: Normal rate, regular rhythm. Grossly normal heart sounds.  2+ radial pulses bilaterally. Respiratory: Normal respiratory effort.  No retractions. Lungs CTAB. Gastrointestinal: Soft and nontender. No distention. Musculoskeletal: No lower extremity tenderness nor edema.  Neurologic:  Normal speech and language. No gross focal neurologic deficits are appreciated.    ED Results / Procedures / Treatments   Labs (all labs ordered are listed, but only abnormal results are displayed) Labs Reviewed  COMPREHENSIVE METABOLIC PANEL WITH GFR - Abnormal; Notable for the following components:      Result Value   Sodium 146 (*)    Glucose, Bld 113 (*)    Total Protein 8.9 (*)    Albumin 5.1 (*)    All other components within normal limits  ETHANOL - Abnormal; Notable for the following components:   Alcohol, Ethyl (B) 246 (*)    All other components within normal limits  SALICYLATE LEVEL - Abnormal; Notable for the following components:   Salicylate Lvl <7.0 (*)    All other components within normal limits  ACETAMINOPHEN  LEVEL - Abnormal; Notable for the following components:   Acetaminophen  (Tylenol ), Serum <10 (*)    All other components within normal limits  CBC - Abnormal; Notable for the following components:   WBC 10.6 (*)    Hemoglobin 11.9 (*)    MCH 25.2 (*)    All other components within normal limits  URINE DRUG SCREEN, QUALITATIVE ([facility] ONLY) - Abnormal; Notable for the following components:   Amphetamines, Ur Screen POSITIVE (*)    Cannabinoid 50 Ng, Ur [place] POSITIVE (*)    All other components within normal limits  POC URINE PREG, ED     EKG  ED ECG REPORT I, Twilla Galea, the attending physician, personally viewed and interpreted this ECG.   Date: 12/19/2023  EKG Time: 13:55  Rate: 78  Rhythm: normal sinus rhythm  Axis: RAD  Intervals:none  ST&T Change:  None  PROCEDURES:  Critical Care performed: No  Procedures   MEDICATIONS ORDERED IN ED: Medications - No data to display   IMPRESSION / MDM / ASSESSMENT AND PLAN / ED COURSE  I reviewed the triage vital signs and the nursing notes.                              23 y.o. female with no significant past medical history who presents to the ED for medical clearance after she was taken into police custody for breaking into a home, admits to drug use and alcohol consumption today.  Patient's presentation is most consistent with acute presentation with potential threat to life or bodily function.  Differential diagnosis includes, but is not limited to, amphetamine use, cocaine use, alcohol intoxication, marijuana use, anemia, electrolyte abnormality, AKI, overdose.  Patient nontoxic-appearing and in no acute distress, vital signs are unremarkable.  She currently denies any complaints and has a benign abdominal exam.  Labs are reassuring without significant anemia, leukocytosis, electrolyte abnormality, or AKI.  LFTs are unremarkable, Tylenol  and salicylate levels are undetectable.  She does have an elevated ethanol level and UDS is positive for amphetamines as well as marijuana.  EKG without evidence of arrhythmia or ischemia, patient may be medically cleared for discharge to jail, was counseled to return to the ED for new or worsening symptoms.  Patient and the [employer] officer agree with plan.      FINAL CLINICAL IMPRESSION(S) / ED DIAGNOSES   Final diagnoses:  Alcoholic intoxication without complication (HCC)  Amphetamine use     Rx / DC Orders   ED Discharge Orders     None        Note:  This document was prepared using Dragon voice recognition software and may include unintentional dictation errors.   Twilla Galea, MD 12/19/23 [phone]

## 2024-04-29 ENCOUNTER — Other Ambulatory Visit: Payer: Self-pay | Admitting: Medical Genetics

## 2024-05-21 ENCOUNTER — Other Ambulatory Visit: Payer: Self-pay

## 2024-05-21 ENCOUNTER — Emergency Department
Admission: EM | Admit: 2024-05-21 | Discharge: 2024-05-21 | Disposition: A | Discharge status: Home / Self Care | Payer: MEDICAID | Attending: Emergency Medicine | Admitting: Emergency Medicine

## 2024-05-21 ENCOUNTER — Encounter: Payer: Self-pay | Admitting: Emergency Medicine

## 2024-05-21 DIAGNOSIS — R202 Paresthesia of skin: Secondary | ICD-10-CM | POA: Diagnosis not present

## 2024-05-21 DIAGNOSIS — R55 Syncope and collapse: Secondary | ICD-10-CM | POA: Insufficient documentation

## 2024-05-21 LAB — COMPREHENSIVE METABOLIC PANEL WITH GFR
ALT: 27 U/L (ref 0–44)
AST: 23 U/L (ref 15–41)
Albumin: 4.2 g/dL (ref 3.5–5.0)
Alkaline Phosphatase: 81 U/L (ref 38–126)
Anion gap: 14 (ref 5–15)
BUN: 14 mg/dL (ref 6–20)
CO2: 22 mmol/L (ref 22–32)
Calcium: 9.5 mg/dL (ref 8.9–10.3)
Chloride: 104 mmol/L (ref 98–111)
Creatinine, Ser: 1.03 mg/dL — ABNORMAL HIGH (ref 0.44–1.00)
GFR, Estimated: >60 mL/min (ref >60)
Glucose, Bld: 109 mg/dL — ABNORMAL HIGH (ref 70–99)
Potassium: 3.6 mmol/L (ref 3.5–5.1)
Sodium: 140 mmol/L (ref 135–145)
Total Bilirubin: 0.8 mg/dL (ref 0.0–1.2)
Total Protein: 7.6 g/dL (ref 6.5–8.1)

## 2024-05-21 LAB — CBC
HCT: 37.3 % (ref 36.0–46.0)
Hemoglobin: 11.7 g/dL — ABNORMAL LOW (ref 12.0–15.0)
MCH: 25.9 pg — ABNORMAL LOW (ref 26.0–34.0)
MCHC: 31.4 g/dL (ref 30.0–36.0)
MCV: 82.7 fL (ref 80.0–100.0)
Platelets: 364 K/uL (ref 150–400)
RBC: 4.51 MIL/uL (ref 3.87–5.11)
RDW: 14.5 % (ref 11.5–15.5)
WBC: 15 K/uL — ABNORMAL HIGH (ref 4.0–10.5)
nRBC: 0 % (ref 0.0–0.2)

## 2024-05-21 NOTE — ED Notes (Signed)
 PT in no acute distress prior to discharge. Discharged instructions reviewed and pt stated that they understand directions. Pt has all belongings with them at time of discharge. Pt stated she would call her mom and wait in the waiting area for her.

## 2024-05-21 NOTE — ED Triage Notes (Signed)
 Pt arrives via ems c/o dizziness all day. Ems reports pt was orthostatic upon standing. HR went from 110 to 138. Pt reports lower abd cramping, on 2nd of menstrual cycle.  Pt reports this happened a few years ago and she was dehydrated. NADN.

## 2024-05-21 NOTE — Discharge Instructions (Signed)
 Return to the ER for new, worsening, recurrent dizziness or lightheadedness, tingling, difficulty breathing, or any other new or worsening symptoms that concern you.  Follow-up with your primary care provider as needed.  Make sure to drink plenty fluids over the next few days.

## 2024-05-21 NOTE — ED Provider Notes (Signed)
 [facility] Provider Note    Event Date/Time   First MD Initiated Contact with Patient 05/21/24 2300     (approximate)   History   Near Syncope   HPI  Jacqueline Maynard is a 23 y.o. female with no significant PMH who presents with near syncope, acute onset earlier this evening.  The patient states that she woke from sleeping and got up, then ate something, then started to feel very lightheaded.  It was worse when changing positions, sitting up, or standing up.  She started to have tingling in both arms and legs.  Her mother noted that she was probably having a panic attack.  She denies any associated chest pain or difficulty breathing.  She has no abdominal pain.  She has no headache.  She states that she is now feeling much better after fluids given by EMS.  She states that this has happened to her before when she was dehydrated.  She was seen for near syncope in the ED while on antibiotics in October of last year.  Her most recent non-ED encounter was in February 2024 with general surgery for a pilonidal cyst.   Physical Exam   Triage Vital Signs: ED Triage Vitals  Encounter Vitals Group     BP 05/21/24 2014 (!) 139/91     Girls Systolic BP Percentile --      Girls Diastolic BP Percentile --      Boys Systolic BP Percentile --      Boys Diastolic BP Percentile --      Pulse Rate 05/21/24 2014 (!) 114     Resp 05/21/24 2014 18     Temp 05/21/24 2014 98.4 F (36.9 C)     Temp Source 05/21/24 2014 Oral     SpO2 05/21/24 2014 100 %     Weight 05/21/24 2015 124 lb (56.2 kg)     Height 05/21/24 2015 5' 2 (1.575 m)     Head Circumference --      Peak Flow --      Pain Score 05/21/24 2015 4     Pain Loc --      Pain Education --      Exclude from Growth Chart --     Most recent vital signs: Vitals:   05/21/24 2014 05/21/24 2300  BP: (!) 139/91 116/78  Pulse: (!) 114 70  Resp: 18 16  Temp: 98.4 F (36.9 C) 98.5 F (36.9 C)  SpO2: 100% 100%     General: Alert, well-appearing, no distress.  CV:  Good peripheral perfusion.  Normal heart sounds. Resp:  Normal effort.  Lungs CTAB. Abd:  No distention.  Other:  EOMI.  PERRLA.  No photophobia.  Normal speech.  Motor intact in all extremities.   ED Results / Procedures / Treatments   Labs (all labs ordered are listed, but only abnormal results are displayed) Labs Reviewed  COMPREHENSIVE METABOLIC PANEL WITH GFR - Abnormal; Notable for the following components:      Result Value   Glucose, Bld 109 (*)    Creatinine, Ser 1.03 (*)    All other components within normal limits  CBC - Abnormal; Notable for the following components:   WBC 15.0 (*)    Hemoglobin 11.7 (*)    MCH 25.9 (*)    All other components within normal limits  POC URINE PREG, ED     EKG  ED ECG REPORT I, Waylon Cassis, the attending physician, personally viewed and interpreted  this ECG.  Date: 05/21/2024 EKG Time: 2023 Rate: 93 Rhythm: normal sinus rhythm with sinus arrhythmia QRS Axis: Right axis Intervals: normal ST/T Wave abnormalities: Nonspecific T wave abnormalities Narrative Interpretation: no evidence of acute ischemia   RADIOLOGY    PROCEDURES:  Critical Care performed: No  Procedures   MEDICATIONS ORDERED IN ED: Medications - No data to display   IMPRESSION / MDM / ASSESSMENT AND PLAN / ED COURSE  I reviewed the triage vital signs and the nursing notes.  23 year old female with no significant PMH presents with an episode of lightheadedness and near syncope associated with bilateral paresthesias and possible acute anxiety/panic, now completely resolved after fluid bolus given by EMS.  Differential diagnosis includes, but is not limited to, dehydration, lecture light abnormality, hypovolemia, anxiety/panic, viral syndrome, other benign etiology.  There is no clinical evidence of cardiac etiology.  Patient's presentation is most consistent with acute complicated illness  / injury requiring diagnostic workup.  CMP shows no acute findings.  CBC shows mild leukocytosis but is otherwise normal.  She is not significantly anemic.  The EKG showed sinus arrhythmia and some nonspecific T wave inversions diffusely.  Overall morphology is similar to prior EKGs.  There are no specific ischemic changes.  The patient's clinical presentation is not consistent with ACS or other acute cardiac etiology.  There is no indication for cardiac labs.  At this time, the patient is feeling better and wants to go home.  There is no indication for additional ED workup.  She is stable for discharge at this time.  Return precautions given, and she expresses understanding.   FINAL CLINICAL IMPRESSION(S) / ED DIAGNOSES   Final diagnoses:  Near syncope  Paresthesias     Rx / DC Orders   ED Discharge Orders     None        Note:  This document was prepared using Dragon voice recognition software and may include unintentional dictation errors.    Jacolyn Pae, MD 05/22/24 [phone]
